# Patient Record
Sex: Male | Born: 1970 | Race: Black or African American | Hispanic: No | Marital: Married | Smoking: Never smoker
Health system: Southern US, Community
[De-identification: ages and names within clinical notes are randomized; demographics above are authoritative.]

## PROBLEM LIST (undated history)

## (undated) DIAGNOSIS — I10 Essential (primary) hypertension: Secondary | ICD-10-CM

## (undated) DIAGNOSIS — T7840XA Allergy, unspecified, initial encounter: Secondary | ICD-10-CM

## (undated) DIAGNOSIS — R569 Unspecified convulsions: Secondary | ICD-10-CM

## (undated) DIAGNOSIS — L509 Urticaria, unspecified: Secondary | ICD-10-CM

## (undated) DIAGNOSIS — G4730 Sleep apnea, unspecified: Secondary | ICD-10-CM

## (undated) DIAGNOSIS — R4020 Unspecified coma: Secondary | ICD-10-CM

## (undated) DIAGNOSIS — I7121 Aneurysm of the ascending aorta, without rupture: Secondary | ICD-10-CM

## (undated) HISTORY — DX: Unspecified coma: R40.20

## (undated) HISTORY — DX: Urticaria, unspecified: L50.9

## (undated) HISTORY — DX: Sleep apnea, unspecified: G47.30

## (undated) HISTORY — DX: Allergy, unspecified, initial encounter: T78.40XA

## (undated) HISTORY — PX: LIPOMA EXCISION: SHX5283

## (undated) HISTORY — PX: SMALL INTESTINE SURGERY: SHX150

## (undated) HISTORY — DX: Unspecified convulsions: R56.9

## (undated) HISTORY — DX: Aneurysm of the ascending aorta, without rupture: I71.21

---

## 2008-06-20 ENCOUNTER — Ambulatory Visit: Payer: Self-pay | Admitting: Family Medicine

## 2008-06-24 ENCOUNTER — Ambulatory Visit: Payer: Self-pay | Admitting: Family Medicine

## 2008-06-24 LAB — CONVERTED CEMR LAB
Nitrite: NEGATIVE
Specific Gravity, Urine: 1.025
WBC Urine, dipstick: NEGATIVE

## 2008-06-30 LAB — CONVERTED CEMR LAB
ALT: 33 units/L (ref 0–53)
AST: 28 units/L (ref 0–37)
Albumin: 4.1 g/dL (ref 3.5–5.2)
Alkaline Phosphatase: 86 units/L (ref 39–117)
BUN: 17 mg/dL (ref 6–23)
Basophils Absolute: 0 10*3/uL (ref 0.0–0.1)
Basophils Relative: 0.7 % (ref 0.0–3.0)
Bilirubin, Direct: 0.1 mg/dL (ref 0.0–0.3)
CO2: 29 meq/L (ref 19–32)
Calcium: 9.5 mg/dL (ref 8.4–10.5)
Chloride: 110 meq/L (ref 96–112)
Cholesterol: 222 mg/dL (ref 0–200)
Creatinine, Ser: 1.2 mg/dL (ref 0.4–1.5)
Direct LDL: 143.8 mg/dL
Eosinophils Absolute: 0.2 10*3/uL (ref 0.0–0.7)
Eosinophils Relative: 3.4 % (ref 0.0–5.0)
GFR calc Af Amer: 89 mL/min
GFR calc non Af Amer: 73 mL/min
Glucose, Bld: 92 mg/dL (ref 70–99)
HCT: 41.5 % (ref 39.0–52.0)
HDL: 50.1 mg/dL (ref >39.0)
Hemoglobin: 14.4 g/dL (ref 13.0–17.0)
Lymphocytes Relative: 33.7 % (ref 12.0–46.0)
MCHC: 34.8 g/dL (ref 30.0–36.0)
MCV: 94 fL (ref 78.0–100.0)
Monocytes Absolute: 0.4 10*3/uL (ref 0.1–1.0)
Monocytes Relative: 9 % (ref 3.0–12.0)
Neutro Abs: 2.6 10*3/uL (ref 1.4–7.7)
Neutrophils Relative %: 53.2 % (ref 43.0–77.0)
Platelets: 306 10*3/uL (ref 150–400)
Potassium: 4.4 meq/L (ref 3.5–5.1)
RBC: 4.41 M/uL (ref 4.22–5.81)
RDW: 12 % (ref 11.5–14.6)
Sodium: 145 meq/L (ref 135–145)
TSH: 1.21 microintl units/mL (ref 0.35–5.50)
Total Bilirubin: 0.9 mg/dL (ref 0.3–1.2)
Total CHOL/HDL Ratio: 4.4
Total Protein: 6.9 g/dL (ref 6.0–8.3)
Triglycerides: 36 mg/dL (ref 0–149)
VLDL: 7 mg/dL (ref 0–40)
WBC: 4.8 10*3/uL (ref 4.5–10.5)

## 2008-11-17 ENCOUNTER — Ambulatory Visit: Payer: Self-pay | Admitting: Family Medicine

## 2008-11-17 DIAGNOSIS — H1045 Other chronic allergic conjunctivitis: Secondary | ICD-10-CM

## 2008-11-17 DIAGNOSIS — J309 Allergic rhinitis, unspecified: Secondary | ICD-10-CM | POA: Insufficient documentation

## 2009-05-18 ENCOUNTER — Ambulatory Visit: Payer: Self-pay | Admitting: Family Medicine

## 2009-05-18 DIAGNOSIS — L509 Urticaria, unspecified: Secondary | ICD-10-CM

## 2009-06-06 ENCOUNTER — Ambulatory Visit: Payer: Self-pay | Admitting: Family Medicine

## 2009-06-06 DIAGNOSIS — J069 Acute upper respiratory infection, unspecified: Secondary | ICD-10-CM

## 2009-08-08 ENCOUNTER — Ambulatory Visit: Payer: Self-pay | Admitting: Family Medicine

## 2009-08-09 ENCOUNTER — Ambulatory Visit: Payer: Self-pay | Admitting: Family Medicine

## 2009-08-09 LAB — CONVERTED CEMR LAB
Nitrite: NEGATIVE
Protein, U semiquant: NEGATIVE
Urobilinogen, UA: 0.2
WBC Urine, dipstick: NEGATIVE

## 2009-08-17 LAB — CONVERTED CEMR LAB
ALT: 51 units/L (ref 0–53)
AST: 32 units/L (ref 0–37)
Alkaline Phosphatase: 93 units/L (ref 39–117)
Basophils Relative: 0.5 % (ref 0.0–3.0)
Chloride: 107 meq/L (ref 96–112)
Direct LDL: 162.9 mg/dL
Eosinophils Relative: 3.5 % (ref 0.0–5.0)
GFR calc non Af Amer: 79.86 mL/min (ref >60)
HCT: 41.7 % (ref 39.0–52.0)
Lymphs Abs: 1.7 10*3/uL (ref 0.7–4.0)
MCV: 97.4 fL (ref 78.0–100.0)
Monocytes Absolute: 0.5 10*3/uL (ref 0.1–1.0)
Monocytes Relative: 9.7 % (ref 3.0–12.0)
Neutrophils Relative %: 54.2 % (ref 43.0–77.0)
Potassium: 4.5 meq/L (ref 3.5–5.1)
RBC: 4.28 M/uL (ref 4.22–5.81)
Total Bilirubin: 0.9 mg/dL (ref 0.3–1.2)
Total CHOL/HDL Ratio: 5
VLDL: 11.6 mg/dL (ref 0.0–40.0)
WBC: 5.2 10*3/uL (ref 4.5–10.5)

## 2009-09-15 ENCOUNTER — Encounter: Payer: Self-pay | Admitting: Family Medicine

## 2009-09-21 ENCOUNTER — Ambulatory Visit: Payer: Self-pay | Admitting: Family Medicine

## 2009-09-26 ENCOUNTER — Telehealth: Payer: Self-pay | Admitting: Family Medicine

## 2009-09-26 ENCOUNTER — Encounter: Payer: Self-pay | Admitting: Family Medicine

## 2009-10-02 ENCOUNTER — Telehealth: Payer: Self-pay | Admitting: Family Medicine

## 2009-10-16 ENCOUNTER — Encounter: Payer: Self-pay | Admitting: Family Medicine

## 2009-11-14 ENCOUNTER — Ambulatory Visit: Payer: Self-pay | Admitting: Family Medicine

## 2010-09-13 NOTE — Progress Notes (Signed)
Summary: ENT referral  Phone Note Call from Patient   Caller: Patient Call For: Dr. Clent Ridges Summary of Call: [phone] Pt would like a referral to ENT as they discussed at his last visit with Dr. Clent Ridges. Initial call taken by: Lynann Beaver CMA,  October 02, 2009 4:30 PM  Follow-up for Phone Call        please ask him what this was about. I have forgotten  Follow-up by: Nelwyn Salisbury MD,  October 03, 2009 1:23 PM  Additional Follow-up for Phone Call Additional follow up Details #1::        Referral to ENT for loud snoring. Additional Follow-up by: Lynann Beaver CMA,  October 03, 2009 2:40 PM    Additional Follow-up for Phone Call Additional follow up Details #2::    okay. Please refer him to ENT for snoring Follow-up by: Nelwyn Salisbury MD,  October 03, 2009 3:11 PM  Additional Follow-up for Phone Call Additional follow up Details #3:: Details for Additional Follow-up Action Taken: referral entered Additional Follow-up by: Alfred Levins, CMA,  October 03, 2009 3:16 PM

## 2010-09-13 NOTE — Progress Notes (Signed)
Summary: Pt called to give list of meds given by allergy doctor  Phone Note Call from Patient Call back at Home Phone [phone]   Caller: Patient Summary of Call: Pt called and said that the following meds were prescribed by Dr Corinda Gubler for allergies: Methylprednisone 16mg  tabs, Fexofenadine 180mg  tabs, 2 Epi pens, Hydroxizine HCL 10mg  tabs. Pt said that today, the hives started up again so he recvd another script of the Methylprednisone.  Initial call taken by: Lucy Antigua,  September 26, 2009 3:25 PM  Follow-up for Phone Call        noted Follow-up by: Nelwyn Salisbury MD,  September 26, 2009 5:04 PM

## 2010-09-13 NOTE — Consult Note (Signed)
Summary: [facility]  [facility]   Imported By: Maryln Gottron 10/23/2009 11:11:16  _____________________________________________________________________  External Attachment:    Type:   Image     Comment:   External Document
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
## 2010-09-13 NOTE — Letter (Signed)
Summary: [facility]  [facility]   Imported By: Maryln Gottron 10/04/2009 15:57:45  _____________________________________________________________________  External Attachment:    Type:   Image     Comment:   External Document

## 2010-09-13 NOTE — Letter (Signed)
Summary: [facility]  [facility]   Imported By: Maryln Gottron 10/18/2009 15:41:01  _____________________________________________________________________  External Attachment:    Type:   Image     Comment:   External Document
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
## 2010-12-10 ENCOUNTER — Encounter: Payer: Self-pay | Admitting: Family Medicine

## 2010-12-10 ENCOUNTER — Ambulatory Visit (INDEPENDENT_AMBULATORY_CARE_PROVIDER_SITE_OTHER): Payer: BC Managed Care – PPO | Admitting: Family Medicine

## 2010-12-10 VITALS — BP 122/82 | HR 68 | Temp 98.9°F | Wt 200.0 lb

## 2010-12-10 DIAGNOSIS — B356 Tinea cruris: Secondary | ICD-10-CM

## 2010-12-10 MED ORDER — KETOCONAZOLE 2 % EX CREA: TOPICAL_CREAM | Freq: Three times a day (TID) | CUTANEOUS | Status: DC

## 2010-12-10 NOTE — Progress Notes (Signed)
  Subjective:    Patient ID: Brandon Campbell, male    DOB: [birth year], 40 y.o.   MRN: [id number]  HPI Here for 2 months of an irritating red rash in the right groin. OTC cortisone cream helps a little. This looks different from the hives that he gets so often.    Review of Systems  Constitutional: Negative.   Skin: Positive for rash.       Objective:   Physical Exam  Constitutional: He appears well-developed and well-nourished.  Skin:       Pale red macular areas in the right groin and proximal thigh           Assessment & Plan:  keep the area as dry as possible.

## 2010-12-25 ENCOUNTER — Encounter: Payer: Self-pay | Admitting: Family Medicine

## 2010-12-25 ENCOUNTER — Ambulatory Visit (INDEPENDENT_AMBULATORY_CARE_PROVIDER_SITE_OTHER): Payer: BC Managed Care – PPO | Admitting: Family Medicine

## 2010-12-25 VITALS — BP 106/68 | HR 109 | Temp 98.1°F | Resp 16 | Wt 199.0 lb

## 2010-12-25 DIAGNOSIS — R209 Unspecified disturbances of skin sensation: Secondary | ICD-10-CM

## 2010-12-25 DIAGNOSIS — R202 Paresthesia of skin: Secondary | ICD-10-CM

## 2010-12-25 NOTE — Progress Notes (Signed)
  Subjective:    Patient ID: Brandon Campbell, male    DOB: 1971-01-16, 40 y.o.   MRN: [id number]  HPI Here with 4 days of very slight numbness on the entire right side of his body from the head to the arm to the trunk to the leg. No other neurologic deficits at all, no blurred vision or HAs or weakness or pain. Yesterday the numbness started to fade away, and today it is almost imperceptible. He cannot account for this, although he does mention some URI symptoms last week including a stuffy head, PND, St, and a dry cough. No fever. These symptoms were going away about the time the numbness started.    Review of Systems  Constitutional: Negative.   HENT: Negative.   Eyes: Negative.   Respiratory: Negative.   Cardiovascular: Negative.   Neurological: Positive for numbness. Negative for dizziness, tremors, seizures, syncope, speech difficulty, weakness, light-headedness and headaches.       Objective:   Physical Exam  Constitutional: He is oriented to person, place, and time. He appears well-developed and well-nourished.  Cardiovascular: Normal rate, regular rhythm, normal heart sounds and intact distal pulses.   Pulmonary/Chest: Effort normal and breath sounds normal.  Neurological: He is alert and oriented to person, place, and time. He has normal reflexes. No cranial nerve deficit. He exhibits normal muscle tone. Coordination normal.          Assessment & Plan:  These symptoms may be related to the viral illness he had last week. At any rate, it seems to be going away. Get plenty of sleep and drink fluids. Recheck prn

## 2010-12-27 ENCOUNTER — Encounter: Payer: Self-pay | Admitting: Family Medicine

## 2010-12-27 ENCOUNTER — Ambulatory Visit (INDEPENDENT_AMBULATORY_CARE_PROVIDER_SITE_OTHER): Payer: BC Managed Care – PPO | Admitting: Family Medicine

## 2010-12-27 VITALS — BP 124/80 | Temp 98.3°F | Wt 196.0 lb

## 2010-12-27 DIAGNOSIS — G629 Polyneuropathy, unspecified: Secondary | ICD-10-CM

## 2010-12-27 DIAGNOSIS — G589 Mononeuropathy, unspecified: Secondary | ICD-10-CM

## 2010-12-27 LAB — CBC WITH DIFFERENTIAL/PLATELET
Basophils Relative: 0.6 % (ref 0.0–3.0)
Eosinophils Relative: 2 % (ref 0.0–5.0)
HCT: 41.3 % (ref 39.0–52.0)
Lymphs Abs: 2.1 10*3/uL (ref 0.7–4.0)
MCV: 95.6 fl (ref 78.0–100.0)
Monocytes Absolute: 0.6 10*3/uL (ref 0.1–1.0)
RBC: 4.32 Mil/uL (ref 4.22–5.81)
WBC: 5.7 10*3/uL (ref 4.5–10.5)

## 2010-12-27 LAB — POCT URINALYSIS DIPSTICK
Blood, UA: NEGATIVE
Glucose, UA: NEGATIVE
Spec Grav, UA: 1.015
Urobilinogen, UA: 1
pH, UA: 6

## 2010-12-27 LAB — HEPATIC FUNCTION PANEL
AST: 37 U/L (ref 0–37)
Alkaline Phosphatase: 76 U/L (ref 39–117)
Bilirubin, Direct: 0.2 mg/dL (ref 0.0–0.3)
Total Protein: 6.1 g/dL (ref 6.0–8.3)

## 2010-12-27 LAB — LIPID PANEL
HDL: 48.2 mg/dL (ref >39.00)
Total CHOL/HDL Ratio: 4

## 2010-12-27 LAB — SEDIMENTATION RATE: Sed Rate: 17 mm/hr (ref 0–22)

## 2010-12-27 LAB — BASIC METABOLIC PANEL
CO2: 30 mEq/L (ref 19–32)
Calcium: 9.5 mg/dL (ref 8.4–10.5)
Potassium: 4.3 mEq/L (ref 3.5–5.1)
Sodium: 140 mEq/L (ref 135–145)

## 2010-12-27 NOTE — Progress Notes (Signed)
  Subjective:    Patient ID: Brandon Campbell, male    DOB: July 21, 1971, 40 y.o.   MRN: [id number]  HPI Here to follow up some intermittent neurologic symptoms that have been going on for about a week. He was here 3 days ago with some numbness that affected the entire right side of his body, and this has subsided. Then yesterday he developed some weakness of the left forearm and hand. This made it difficult for him to grasp objects, type on his computer, or write things on paper. Today this has also resolved. Still no vision changes or speech changes or HA. No fevers. He has no family history of neurologic disorders.    Review of Systems  HENT: Negative.   Eyes: Negative.   Respiratory: Negative.   Cardiovascular: Negative.   Neurological: Positive for weakness and numbness. Negative for dizziness, tremors, seizures, syncope, facial asymmetry, speech difficulty, light-headedness and headaches.  Psychiatric/Behavioral: Negative.        Objective:   Physical Exam  Constitutional: He appears well-developed and well-nourished.  Eyes: Conjunctivae are normal. Pupils are equal, round, and reactive to light.  Neck: No thyromegaly present.  Cardiovascular: Normal rate, regular rhythm, normal heart sounds and intact distal pulses.   Pulmonary/Chest: Effort normal and breath sounds normal.  Lymphadenopathy:    He has no cervical adenopathy.  Neurological: He is alert. He has normal reflexes. No cranial nerve deficit. He exhibits normal muscle tone. Coordination normal.          Assessment & Plan:  Intermittent numbness and weakness, which may indicate a neuropathy of some sort. He is concerned about things like multiple sclerosis, but it is too early to make any specific diagnoses now. We will start with some labs today. We may refer him to Neurology if these symptoms continue.

## 2010-12-28 ENCOUNTER — Telehealth: Payer: Self-pay

## 2010-12-28 NOTE — Telephone Encounter (Signed)
Left a message on voicemail for pt to return call

## 2010-12-28 NOTE — Telephone Encounter (Signed)
Message copied by Earle Gell on Fri Dec 28, 2010  1:44 PM ------      Message from: Dwaine Deter      Created: Fri Dec 28, 2010  1:18 PM       Normal except mildly high chol and a positive ANA. The final ANA result is still pending, so this may or may not be important

## 2011-06-11 ENCOUNTER — Encounter: Payer: Self-pay | Admitting: Family Medicine

## 2011-06-11 ENCOUNTER — Ambulatory Visit (INDEPENDENT_AMBULATORY_CARE_PROVIDER_SITE_OTHER): Payer: BC Managed Care – PPO | Admitting: Family Medicine

## 2011-06-11 VITALS — BP 112/76 | HR 83 | Temp 98.5°F | Wt 195.0 lb

## 2011-06-11 DIAGNOSIS — M545 Low back pain: Secondary | ICD-10-CM

## 2011-06-11 DIAGNOSIS — M542 Cervicalgia: Secondary | ICD-10-CM

## 2011-06-11 NOTE — Progress Notes (Signed)
  Subjective:    Patient ID: Brandon Campbell, male    DOB: 12/24/1970, 40 y.o.   MRN: [id number]  HPI Here for 3 weeks of dull aches and pains in the neck and the lower back. This started during a trip with his wife to [place] when they slept in some bed and breakfast inns. One of these had a very uncomfortable bed. He feels worse after lying in bed or sitting still, then he feels better when he is up moving around. Advil helps.    Review of Systems  Constitutional: Negative.   Musculoskeletal: Positive for back pain.       Objective:   Physical Exam  Constitutional: He appears well-developed and well-nourished.  Musculoskeletal:       Mildly tender in the posterior neck but with no spasm and full ROM. The lower back exam is completely normal           Assessment & Plan:  Probable facet arthropathy. Try Aleve 2 tablets bid for 2 weeks. Recheck prn

## 2011-09-02 ENCOUNTER — Other Ambulatory Visit (INDEPENDENT_AMBULATORY_CARE_PROVIDER_SITE_OTHER): Payer: BC Managed Care – PPO

## 2011-09-02 DIAGNOSIS — Z0000 Encounter for general adult medical examination without abnormal findings: Secondary | ICD-10-CM

## 2011-09-02 LAB — POCT URINALYSIS DIPSTICK
Bilirubin, UA: NEGATIVE
Blood, UA: NEGATIVE
Ketones, UA: NEGATIVE
pH, UA: 6

## 2011-09-02 LAB — LIPID PANEL
Cholesterol: 172 mg/dL (ref 0–200)
HDL: 43.9 mg/dL (ref >39.00)
Triglycerides: 110 mg/dL (ref 0.0–149.0)

## 2011-09-02 LAB — HEPATIC FUNCTION PANEL: Albumin: 3.7 g/dL (ref 3.5–5.2)

## 2011-09-02 LAB — CBC WITH DIFFERENTIAL/PLATELET
Basophils Absolute: 0 10*3/uL (ref 0.0–0.1)
Eosinophils Absolute: 0.1 10*3/uL (ref 0.0–0.7)
HCT: 40.7 % (ref 39.0–52.0)
Hemoglobin: 13.9 g/dL (ref 13.0–17.0)
Lymphocytes Relative: 35.7 % (ref 12.0–46.0)
Lymphs Abs: 2.1 10*3/uL (ref 0.7–4.0)
MCHC: 34.3 g/dL (ref 30.0–36.0)
Neutro Abs: 3 10*3/uL (ref 1.4–7.7)
RDW: 13.4 % (ref 11.5–14.6)

## 2011-09-02 LAB — BASIC METABOLIC PANEL
CO2: 27 mEq/L (ref 19–32)
Calcium: 8.7 mg/dL (ref 8.4–10.5)
Glucose, Bld: 77 mg/dL (ref 70–99)
Sodium: 138 mEq/L (ref 135–145)

## 2011-09-02 LAB — TSH: TSH: 1.46 u[IU]/mL (ref 0.35–5.50)

## 2011-09-03 NOTE — Progress Notes (Signed)
Quick Note:  Left voice message ______ 

## 2011-09-09 ENCOUNTER — Encounter: Payer: Self-pay | Admitting: Family Medicine

## 2011-09-09 ENCOUNTER — Ambulatory Visit (INDEPENDENT_AMBULATORY_CARE_PROVIDER_SITE_OTHER): Payer: BC Managed Care – PPO | Admitting: Family Medicine

## 2011-09-09 VITALS — BP 124/86 | HR 79 | Temp 98.4°F | Ht 72.25 in | Wt 196.0 lb

## 2011-09-09 DIAGNOSIS — Z0000 Encounter for general adult medical examination without abnormal findings: Secondary | ICD-10-CM

## 2011-09-09 MED ORDER — HYDROXYZINE HCL 10 MG PO TABS: 10.0000 mg | ORAL_TABLET | ORAL | Status: DC | PRN

## 2011-09-09 NOTE — Progress Notes (Signed)
  Subjective:    Patient ID: Brandon Campbell, male    DOB: 1971-04-08, 41 y.o.   MRN: [id number]  HPI 41 yr old male for a cpx. He feels fine and has no concerns. He is doing "Insanity" DVD workouts at home with his wife.    Review of Systems  Constitutional: Negative.   HENT: Negative.   Eyes: Negative.   Respiratory: Negative.   Cardiovascular: Negative.   Gastrointestinal: Negative.   Genitourinary: Negative.   Musculoskeletal: Negative.   Skin: Negative.   Neurological: Negative.   Hematological: Negative.   Psychiatric/Behavioral: Negative.        Objective:   Physical Exam  Constitutional: He is oriented to person, place, and time. He appears well-developed and well-nourished. No distress.  HENT:  Head: Normocephalic and atraumatic.  Right Ear: External ear normal.  Left Ear: External ear normal.  Nose: Nose normal.  Mouth/Throat: Oropharynx is clear and moist. No oropharyngeal exudate.  Eyes: Conjunctivae and EOM are normal. Pupils are equal, round, and reactive to light. Right eye exhibits no discharge. Left eye exhibits no discharge. No scleral icterus.  Neck: Neck supple. No JVD present. No tracheal deviation present. No thyromegaly present.  Cardiovascular: Normal rate, regular rhythm, normal heart sounds and intact distal pulses.  Exam reveals no gallop and no friction rub.   No murmur heard. Pulmonary/Chest: Effort normal and breath sounds normal. No respiratory distress. He has no wheezes. He has no rales. He exhibits no tenderness.  Abdominal: Soft. Bowel sounds are normal. He exhibits no distension and no mass. There is no tenderness. There is no rebound and no guarding.  Genitourinary: Rectum normal, prostate normal and penis normal. Guaiac negative stool. No penile tenderness.  Musculoskeletal: Normal range of motion. He exhibits no edema and no tenderness.  Lymphadenopathy:    He has no cervical adenopathy.  Neurological: He is alert and oriented to  person, place, and time. He has normal reflexes. No cranial nerve deficit. He exhibits normal muscle tone. Coordination normal.  Skin: Skin is warm and dry. No rash noted. He is not diaphoretic. No erythema. No pallor.  Psychiatric: He has a normal mood and affect. His behavior is normal. Judgment and thought content normal.          Assessment & Plan:  Well exam

## 2011-10-22 ENCOUNTER — Ambulatory Visit (INDEPENDENT_AMBULATORY_CARE_PROVIDER_SITE_OTHER): Payer: BC Managed Care – PPO | Admitting: Family Medicine

## 2011-10-22 ENCOUNTER — Encounter: Payer: Self-pay | Admitting: Family Medicine

## 2011-10-22 VITALS — BP 120/80 | HR 76 | Temp 97.8°F | Wt 204.0 lb

## 2011-10-22 DIAGNOSIS — H1045 Other chronic allergic conjunctivitis: Secondary | ICD-10-CM

## 2011-10-22 DIAGNOSIS — H1010 Acute atopic conjunctivitis, unspecified eye: Secondary | ICD-10-CM

## 2011-10-22 NOTE — Progress Notes (Signed)
  Subjective:    Patient ID: Brandon Campbell, male    DOB: 02-27-1971, 41 y.o.   MRN: [id number]  HPI Here for 3 days of itching and burning in the eyes. Some tearing but no discharge. He wears contacts but does not have glasses to wear. No other symptoms. He takes Xyzal every day. He has some prednisone eye drops at home which he has been using bid.    Review of Systems  Constitutional: Negative.   HENT: Positive for rhinorrhea and sneezing. Negative for ear pain, congestion, postnasal drip, sinus pressure and ear discharge.   Eyes: Positive for redness and itching. Negative for discharge and visual disturbance.  Respiratory: Negative.        Objective:   Physical Exam  Constitutional: He appears well-developed and well-nourished.  HENT:  Right Ear: External ear normal.  Left Ear: External ear normal.  Nose: Nose normal.  Mouth/Throat: Oropharynx is clear and moist. No oropharyngeal exudate.  Eyes: Conjunctivae are normal. Right eye exhibits no discharge. Left eye exhibits no discharge.       Mild puffiness of the eyelids   Neck: Neck supple.  Pulmonary/Chest: Effort normal and breath sounds normal.  Lymphadenopathy:    He has no cervical adenopathy.          Assessment & Plan:  This is due to allergies as expected. He will continue the eye drops prn. I suggested he get some back up glasses to wear when this occurs.

## 2012-12-21 ENCOUNTER — Other Ambulatory Visit: Payer: BC Managed Care – PPO

## 2012-12-24 ENCOUNTER — Other Ambulatory Visit (INDEPENDENT_AMBULATORY_CARE_PROVIDER_SITE_OTHER): Payer: BC Managed Care – PPO

## 2012-12-24 DIAGNOSIS — Z0000 Encounter for general adult medical examination without abnormal findings: Secondary | ICD-10-CM

## 2012-12-24 LAB — CBC WITH DIFFERENTIAL/PLATELET
Basophils Absolute: 0.1 10*3/uL (ref 0.0–0.1)
Eosinophils Relative: 3.6 % (ref 0.0–5.0)
HCT: 37.6 % — ABNORMAL LOW (ref 39.0–52.0)
Lymphs Abs: 2 10*3/uL (ref 0.7–4.0)
Monocytes Absolute: 0.5 10*3/uL (ref 0.1–1.0)
Monocytes Relative: 8.8 % (ref 3.0–12.0)
Neutrophils Relative %: 49.6 % (ref 43.0–77.0)
Platelets: 291 10*3/uL (ref 150.0–400.0)
RDW: 13.4 % (ref 11.5–14.6)
WBC: 5.3 10*3/uL (ref 4.5–10.5)

## 2012-12-24 LAB — LIPID PANEL: Total CHOL/HDL Ratio: 5

## 2012-12-24 LAB — BASIC METABOLIC PANEL
BUN: 14 mg/dL (ref 6–23)
Calcium: 9.1 mg/dL (ref 8.4–10.5)
Creatinine, Ser: 1.2 mg/dL (ref 0.4–1.5)
GFR: 85.23 mL/min (ref >60.00)
Glucose, Bld: 87 mg/dL (ref 70–99)
Potassium: 3.9 mEq/L (ref 3.5–5.1)

## 2012-12-24 LAB — POCT URINALYSIS DIPSTICK
Blood, UA: NEGATIVE
Nitrite, UA: NEGATIVE
Urobilinogen, UA: 0.2
pH, UA: 6

## 2012-12-24 LAB — HEPATIC FUNCTION PANEL
AST: 24 U/L (ref 0–37)
Bilirubin, Direct: 0.1 mg/dL (ref 0.0–0.3)
Total Bilirubin: 0.7 mg/dL (ref 0.3–1.2)

## 2012-12-24 LAB — PSA: PSA: 0.77 ng/mL (ref 0.10–4.00)

## 2012-12-24 LAB — TSH: TSH: 2.28 u[IU]/mL (ref 0.35–5.50)

## 2012-12-25 ENCOUNTER — Other Ambulatory Visit: Payer: BC Managed Care – PPO

## 2012-12-28 ENCOUNTER — Ambulatory Visit (INDEPENDENT_AMBULATORY_CARE_PROVIDER_SITE_OTHER): Payer: BC Managed Care – PPO | Admitting: Family Medicine

## 2012-12-28 ENCOUNTER — Encounter: Payer: Self-pay | Admitting: Family Medicine

## 2012-12-28 VITALS — BP 112/70 | HR 87 | Temp 98.1°F | Ht 72.5 in | Wt 209.0 lb

## 2012-12-28 DIAGNOSIS — Z0000 Encounter for general adult medical examination without abnormal findings: Secondary | ICD-10-CM

## 2012-12-28 NOTE — Progress Notes (Signed)
  Subjective:    Patient ID: Brandon Campbell, male    DOB: Nov 20, 1970, 42 y.o.   MRN: [id number]  HPI 42 yr old male for a cpx. He feels fine and has no concerns.    Review of Systems  Constitutional: Negative.   HENT: Negative.   Eyes: Negative.   Respiratory: Negative.   Cardiovascular: Negative.   Gastrointestinal: Negative.   Genitourinary: Negative.   Musculoskeletal: Negative.   Skin: Negative.   Neurological: Negative.   Psychiatric/Behavioral: Negative.        Objective:   Physical Exam  Constitutional: He is oriented to person, place, and time. He appears well-developed and well-nourished. No distress.  HENT:  Head: Normocephalic and atraumatic.  Right Ear: External ear normal.  Left Ear: External ear normal.  Nose: Nose normal.  Mouth/Throat: Oropharynx is clear and moist. No oropharyngeal exudate.  Eyes: Conjunctivae and EOM are normal. Pupils are equal, round, and reactive to light. Right eye exhibits no discharge. Left eye exhibits no discharge. No scleral icterus.  Neck: Neck supple. No JVD present. No tracheal deviation present. No thyromegaly present.  Cardiovascular: Normal rate, regular rhythm, normal heart sounds and intact distal pulses.  Exam reveals no gallop and no friction rub.   No murmur heard. Pulmonary/Chest: Effort normal and breath sounds normal. No respiratory distress. He has no wheezes. He has no rales. He exhibits no tenderness.  Abdominal: Soft. Bowel sounds are normal. He exhibits no distension and no mass. There is no tenderness. There is no rebound and no guarding.  Genitourinary: Rectum normal, prostate normal and penis normal. Guaiac negative stool. No penile tenderness.  Musculoskeletal: Normal range of motion. He exhibits no edema and no tenderness.  Lymphadenopathy:    He has no cervical adenopathy.  Neurological: He is alert and oriented to person, place, and time. He has normal reflexes. No cranial nerve deficit. He exhibits  normal muscle tone. Coordination normal.  Skin: Skin is warm and dry. No rash noted. He is not diaphoretic. No erythema. No pallor.  Psychiatric: He has a normal mood and affect. His behavior is normal. Judgment and thought content normal.          Assessment & Plan:  Well exam.

## 2014-07-05 ENCOUNTER — Other Ambulatory Visit (INDEPENDENT_AMBULATORY_CARE_PROVIDER_SITE_OTHER): Payer: BC Managed Care – PPO

## 2014-07-05 DIAGNOSIS — Z0000 Encounter for general adult medical examination without abnormal findings: Secondary | ICD-10-CM

## 2014-07-05 LAB — CBC WITH DIFFERENTIAL/PLATELET
BASOS PCT: 0.7 % (ref 0.0–3.0)
Basophils Absolute: 0 10*3/uL (ref 0.0–0.1)
Eosinophils Absolute: 0.2 10*3/uL (ref 0.0–0.7)
Eosinophils Relative: 2.8 % (ref 0.0–5.0)
HEMATOCRIT: 43.2 % (ref 39.0–52.0)
HEMOGLOBIN: 14.6 g/dL (ref 13.0–17.0)
LYMPHS ABS: 2.3 10*3/uL (ref 0.7–4.0)
LYMPHS PCT: 36.4 % (ref 12.0–46.0)
MCHC: 33.7 g/dL (ref 30.0–36.0)
MCV: 93.9 fl (ref 78.0–100.0)
MONOS PCT: 10.2 % (ref 3.0–12.0)
Monocytes Absolute: 0.6 10*3/uL (ref 0.1–1.0)
NEUTROS ABS: 3.2 10*3/uL (ref 1.4–7.7)
Neutrophils Relative %: 49.9 % (ref 43.0–77.0)
Platelets: 325 10*3/uL (ref 150.0–400.0)
RBC: 4.6 Mil/uL (ref 4.22–5.81)
RDW: 13.1 % (ref 11.5–15.5)
WBC: 6.3 10*3/uL (ref 4.0–10.5)

## 2014-07-05 LAB — LIPID PANEL
CHOL/HDL RATIO: 6
Cholesterol: 212 mg/dL — ABNORMAL HIGH (ref 0–200)
HDL: 34.5 mg/dL — ABNORMAL LOW (ref >39.00)
LDL CALC: 149 mg/dL — AB (ref 0–99)
NONHDL: 177.5
TRIGLYCERIDES: 142 mg/dL (ref 0.0–149.0)
VLDL: 28.4 mg/dL (ref 0.0–40.0)

## 2014-07-05 LAB — POCT URINALYSIS DIPSTICK
BILIRUBIN UA: NEGATIVE
GLUCOSE UA: NEGATIVE
KETONES UA: NEGATIVE
Leukocytes, UA: NEGATIVE
Nitrite, UA: NEGATIVE
Protein, UA: NEGATIVE
RBC UA: NEGATIVE
Urobilinogen, UA: 0.2
pH, UA: 6

## 2014-07-05 LAB — BASIC METABOLIC PANEL
BUN: 13 mg/dL (ref 6–23)
CHLORIDE: 100 meq/L (ref 96–112)
CO2: 28 meq/L (ref 19–32)
CREATININE: 1.4 mg/dL (ref 0.4–1.5)
Calcium: 9.5 mg/dL (ref 8.4–10.5)
GFR: 72.69 mL/min (ref >60.00)
Glucose, Bld: 88 mg/dL (ref 70–99)
Potassium: 4.5 mEq/L (ref 3.5–5.1)
Sodium: 137 mEq/L (ref 135–145)

## 2014-07-05 LAB — HEPATIC FUNCTION PANEL
ALBUMIN: 4.3 g/dL (ref 3.5–5.2)
ALT: 29 U/L (ref 0–53)
AST: 25 U/L (ref 0–37)
Alkaline Phosphatase: 116 U/L (ref 39–117)
BILIRUBIN DIRECT: 0.1 mg/dL (ref 0.0–0.3)
TOTAL PROTEIN: 6.8 g/dL (ref 6.0–8.3)
Total Bilirubin: 0.8 mg/dL (ref 0.2–1.2)

## 2014-07-05 LAB — TSH: TSH: 2 u[IU]/mL (ref 0.35–4.50)

## 2014-07-05 LAB — PSA: PSA: 0.86 ng/mL (ref 0.10–4.00)

## 2014-07-12 ENCOUNTER — Encounter: Payer: Self-pay | Admitting: Family Medicine

## 2014-07-12 ENCOUNTER — Ambulatory Visit (INDEPENDENT_AMBULATORY_CARE_PROVIDER_SITE_OTHER): Payer: BC Managed Care – PPO | Admitting: Family Medicine

## 2014-07-12 VITALS — BP 118/83 | HR 68 | Temp 98.4°F | Ht 72.5 in | Wt 216.0 lb

## 2014-07-12 DIAGNOSIS — Z0000 Encounter for general adult medical examination without abnormal findings: Secondary | ICD-10-CM

## 2014-07-12 NOTE — Progress Notes (Signed)
   Subjective:    Patient ID: Brandon Campbell, male    DOB: 06/11/1971, 43 y.o.   MRN: [id number]  HPI 43 yr old male for a cpx. He feels well. We discussed the increase in the LDL in his labs and he reports that his diet has not chenaged much in the past year. However he used to take up to 4 capsules of fish oil daily but hs stopped this a year ago.    Review of Systems  Constitutional: Negative.   HENT: Negative.   Eyes: Negative.   Respiratory: Negative.   Cardiovascular: Negative.   Gastrointestinal: Negative.   Genitourinary: Negative.   Musculoskeletal: Negative.   Skin: Negative.   Neurological: Negative.   Psychiatric/Behavioral: Negative.        Objective:   Physical Exam  Constitutional: He is oriented to person, place, and time. He appears well-developed and well-nourished. No distress.  HENT:  Head: Normocephalic and atraumatic.  Right Ear: External ear normal.  Left Ear: External ear normal.  Nose: Nose normal.  Mouth/Throat: Oropharynx is clear and moist. No oropharyngeal exudate.  Eyes: Conjunctivae and EOM are normal. Pupils are equal, round, and reactive to light. Right eye exhibits no discharge. Left eye exhibits no discharge. No scleral icterus.  Neck: Neck supple. No JVD present. No tracheal deviation present. No thyromegaly present.  Cardiovascular: Normal rate, regular rhythm, normal heart sounds and intact distal pulses.  Exam reveals no gallop and no friction rub.   No murmur heard. Pulmonary/Chest: Effort normal and breath sounds normal. No respiratory distress. He has no wheezes. He has no rales. He exhibits no tenderness.  Abdominal: Soft. Bowel sounds are normal. He exhibits no distension and no mass. There is no tenderness. There is no rebound and no guarding.  Genitourinary: Rectum normal, prostate normal and penis normal. Guaiac negative stool. No penile tenderness.  Musculoskeletal: Normal range of motion. He exhibits no edema or tenderness.   Lymphadenopathy:    He has no cervical adenopathy.  Neurological: He is alert and oriented to person, place, and time. He has normal reflexes. No cranial nerve deficit. He exhibits normal muscle tone. Coordination normal.  Skin: Skin is warm and dry. No rash noted. He is not diaphoretic. No erythema. No pallor.  Psychiatric: He has a normal mood and affect. His behavior is normal. Judgment and thought content normal.          Assessment & Plan:  Well exam. He will resume taking fish oil supplements.

## 2014-07-12 NOTE — Progress Notes (Signed)
Pre visit review using our clinic review tool, if applicable. No additional management support is needed unless otherwise documented below in the visit note. 

## 2014-09-15 ENCOUNTER — Encounter (HOSPITAL_COMMUNITY): Payer: Self-pay | Admitting: Emergency Medicine

## 2014-09-15 ENCOUNTER — Emergency Department (HOSPITAL_COMMUNITY)
Admission: EM | Admit: 2014-09-15 | Discharge: 2014-09-15 | Disposition: A | Discharge status: Home / Self Care | Payer: BLUE CROSS/BLUE SHIELD | Attending: Emergency Medicine | Admitting: Emergency Medicine

## 2014-09-15 DIAGNOSIS — Z872 Personal history of diseases of the skin and subcutaneous tissue: Secondary | ICD-10-CM | POA: Diagnosis not present

## 2014-09-15 DIAGNOSIS — G40909 Epilepsy, unspecified, not intractable, without status epilepticus: Secondary | ICD-10-CM | POA: Diagnosis present

## 2014-09-15 DIAGNOSIS — Z79899 Other long term (current) drug therapy: Secondary | ICD-10-CM | POA: Diagnosis not present

## 2014-09-15 DIAGNOSIS — R569 Unspecified convulsions: Secondary | ICD-10-CM

## 2014-09-15 LAB — CBC WITH DIFFERENTIAL/PLATELET
Basophils Absolute: 0 10*3/uL (ref 0.0–0.1)
Basophils Relative: 0 % (ref 0–1)
EOS PCT: 2 % (ref 0–5)
Eosinophils Absolute: 0.2 10*3/uL (ref 0.0–0.7)
HEMATOCRIT: 40.2 % (ref 39.0–52.0)
Hemoglobin: 14.2 g/dL (ref 13.0–17.0)
LYMPHS ABS: 3.6 10*3/uL (ref 0.7–4.0)
Lymphocytes Relative: 33 % (ref 12–46)
MCH: 31.8 pg (ref 26.0–34.0)
MCHC: 35.3 g/dL (ref 30.0–36.0)
MCV: 90.1 fL (ref 78.0–100.0)
MONO ABS: 0.7 10*3/uL (ref 0.1–1.0)
Monocytes Relative: 6 % (ref 3–12)
NEUTROS ABS: 6.3 10*3/uL (ref 1.7–7.7)
Neutrophils Relative %: 59 % (ref 43–77)
PLATELETS: 299 10*3/uL (ref 150–400)
RBC: 4.46 MIL/uL (ref 4.22–5.81)
RDW: 13.2 % (ref 11.5–15.5)
WBC: 10.7 10*3/uL — ABNORMAL HIGH (ref 4.0–10.5)

## 2014-09-15 LAB — COMPREHENSIVE METABOLIC PANEL
ALT: 36 U/L (ref 0–53)
ANION GAP: 12 (ref 5–15)
AST: 33 U/L (ref 0–37)
Albumin: 4.1 g/dL (ref 3.5–5.2)
Alkaline Phosphatase: 105 U/L (ref 39–117)
BILIRUBIN TOTAL: 0.8 mg/dL (ref 0.3–1.2)
BUN: 20 mg/dL (ref 6–23)
CO2: 21 mmol/L (ref 19–32)
Calcium: 9 mg/dL (ref 8.4–10.5)
Chloride: 103 mmol/L (ref 96–112)
Creatinine, Ser: 1.48 mg/dL — ABNORMAL HIGH (ref 0.50–1.35)
GFR calc Af Amer: 66 mL/min — ABNORMAL LOW (ref >90)
GFR calc non Af Amer: 57 mL/min — ABNORMAL LOW (ref >90)
Glucose, Bld: 166 mg/dL — ABNORMAL HIGH (ref 70–99)
POTASSIUM: 3.3 mmol/L — AB (ref 3.5–5.1)
SODIUM: 136 mmol/L (ref 135–145)
Total Protein: 6.8 g/dL (ref 6.0–8.3)

## 2014-09-15 LAB — CBG MONITORING, ED: Glucose-Capillary: 116 mg/dL — ABNORMAL HIGH (ref 70–99)

## 2014-09-15 MED ORDER — SODIUM CHLORIDE 0.9 % IV SOLN
100.0000 mg | Freq: Once | INTRAVENOUS | Status: AC
  Administered 2014-09-15: 100 mg via INTRAVENOUS
  Filled 2014-09-15: qty 10

## 2014-09-15 MED ORDER — LACOSAMIDE 50 MG PO TABS
50.0000 mg | ORAL_TABLET | Freq: Two times a day (BID) | ORAL | Status: DC
Start: 2014-09-15 — End: 2014-09-16

## 2014-09-15 NOTE — ED Notes (Signed)
Patient is alert and orientedx4.  Patient was explained discharge instructions and they understood them with no questions.  The patient's wife, [name] is taking the patient home.

## 2014-09-15 NOTE — Discharge Instructions (Signed)

## 2014-09-15 NOTE — ED Provider Notes (Deleted)
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 He had seen a neurologist when [place] living in [place] but does not have a neurologist locally. He has not currently on any medications for seizures. On exam, he is awake and alert. Lungs are clear and heart is regular rate and rhythm. Neurologic exam is normal. Because of history of complex partial seizures, it is felt that he will need to be started on anticonvulsants. Case was discussed with neurology who recommended lacosamide and loading doses given in the ED.  I saw and evaluated the patient, reviewed the resident's note and I agree with the findings and plan.   Dione Boozeavid Nolan Lasser, MD 09/15/14 2218

## 2014-09-15 NOTE — ED Notes (Signed)
Family at bedside. 

## 2014-09-15 NOTE — ED Notes (Signed)
The patient's wife was in the kitchen cooking and he was in the room watching TV.  The wife advised EMS that she heard something fall and when she went in to the room he was on the floor having a grand mal seizure. ( she is familiar with seizures because her brother has seizures) The patient's wife said the seizure lasted about 45sec and the patient was post-ictal when EMS arrived.  The patient was not incontinent of urine or stool.  He was transported here to be evaluated.  The patient told EMS that he just felt like he fell asleep and when he woke up he was not aware of what had happened.

## 2014-09-15 NOTE — ED Provider Notes (Signed)
CSN: [id number]     Arrival date & time 09/15/14  1951 History   First MD Initiated Contact with Patient 09/15/14 2016     Chief Complaint  Patient presents with  . Seizures    The patient's wife was in the kitchen cooking and he was in the room watching TV.  The wife advised EMS that she heard something fall and when she went in to the room he was on the floor having a grand mal seizure.     (Consider location/radiation/quality/duration/timing/severity/associated sxs/prior Treatment) HPI 44 year old male with past history of partial complex seizures which was diagnosed in 2006 for which patient was previously on Keppra presents to ED for grand mal type seizure lasting 1-2 minutes today. Prior to this patient was in his usual state of health and has no report of recent illness. Patient denies having used alcohol or other drugs. Not taking any medications. No recent head injury, ear infection. Patient states he feels normal now. His sister witnessed this and states he did not appear to incur any injuries during his seizure. Patient reports since stopping Keppra many years ago he has had his partial seizures which she describes as an aura and disorientation without any abnormal movements at least monthly. He states they have been improving. He states he does not see a neurologist any longer. No other complaints at this time. Past Medical History  Diagnosis Date  . Seizures     partial complex in 2006, resolved   . Allergy     sees Dr. Sidney Ace  . Hives     sees Dr. Reginia Naas at [facility]   History reviewed. No pertinent past surgical history. History reviewed. No pertinent family history. History  Substance Use Topics  . Smoking status: Never Smoker   . Smokeless tobacco: Never Used  . Alcohol Use: No    Review of Systems  Constitutional: Negative for fever, activity change and appetite change.  HENT: Negative for congestion, rhinorrhea and sore throat.   Eyes:  Negative for visual disturbance.  Respiratory: Negative for cough and shortness of breath.   Cardiovascular: Negative for chest pain, palpitations and leg swelling.  Gastrointestinal: Negative for nausea, vomiting, abdominal pain and diarrhea.  Genitourinary: Negative for dysuria, flank pain, decreased urine volume and difficulty urinating.  Musculoskeletal: Negative for back pain and neck pain.  Skin: Negative for rash.  Neurological: Positive for seizures. Negative for dizziness, syncope, speech difficulty, weakness, light-headedness, numbness and headaches.  Psychiatric/Behavioral: Negative for confusion.      Allergies  Review of patient's allergies indicates no known allergies.  Home Medications   Prior to Admission medications   Medication Sig Start Date End Date Taking? Authorizing Provider  fish oil-omega-3 fatty acids 1000 MG capsule Take 2 g by mouth daily.    Historical Provider, MD  loratadine (CLARITIN) 10 MG tablet Take 10 mg by mouth daily.    Historical Provider, MD   BP 129/79 mmHg  Pulse 77  Temp(Src) 97.7 F (36.5 C) (Oral)  Resp 15  SpO2 92% Physical Exam  Constitutional: He is oriented to person, place, and time. He appears well-developed and well-nourished. No distress.  HENT:  Head: Normocephalic and atraumatic.  Nose: Nose normal.  Mouth/Throat: Oropharynx is clear and moist. No oropharyngeal exudate.  Eyes: Conjunctivae and EOM are normal.  Neck: Normal range of motion. Neck supple. No JVD present.  Cardiovascular: Normal rate, regular rhythm, normal heart sounds and intact distal pulses.   Pulmonary/Chest: Effort normal and  breath sounds normal. No respiratory distress.  Abdominal: Soft. He exhibits no distension. There is no tenderness. There is no rebound and no guarding.  Musculoskeletal: Normal range of motion.  Neurological: He is alert and oriented to person, place, and time. No cranial nerve deficit. GCS eye subscore is 4. GCS verbal subscore  is 5. GCS motor subscore is 6.  HDS, AAOx4. PERRL, EOMI, TML, face sym. CN 2-12 grossly intact. 5/5 sym, no drift, SILT, normal gait and coordination.    Skin: Skin is warm and dry. No rash noted.  Psychiatric: He has a normal mood and affect.  Nursing note and vitals reviewed.   ED Course  Procedures (including critical care time) Labs Review Labs Reviewed  CBC WITH DIFFERENTIAL/PLATELET - Abnormal; Notable for the following:    WBC 10.7 (*)    All other components within normal limits  COMPREHENSIVE METABOLIC PANEL - Abnormal; Notable for the following:    Potassium 3.3 (*)    Glucose, Bld 166 (*)    Creatinine, Ser 1.48 (*)    GFR calc non Af Amer 57 (*)    GFR calc Af Amer 66 (*)    All other components within normal limits  CBG MONITORING, ED - Abnormal; Notable for the following:    Glucose-Capillary 116 (*)    All other components within normal limits    Imaging Review No results found.   EKG Interpretation None      MDM   Final diagnoses:  None    KHORI ROSEVEAR is a 44 y.o. male with H&P as above. Patient arrives hemodynamically stable and in no apparent distress after having a seizure. Patient is at baseline now per family. Patient has history of partial complex seizure but has never had a grand mal seizure like he did today. Patient has a benign neuro exam currently. No recent signs or symptoms to suggest infection. Screening labs were sent and are relatively unremarkable. Patient was noted to have a creatinine of 1.48 and 8 appears that patient has had similar elevated creatinine last year. Patient states he is drinking plenty of fluids and having no urinary symptoms. I have advised him to follow closely with his PCP for recheck. Discussed case with neurology and they advise IV Vimpat load and have patient take Vimpat 50 mg twice a day with close neurology follow-up. Clinical Impression: 1. Seizure     Disposition: Discharge  Condition: Good  I  have discussed the results, Dx and Tx plan with the pt(& family if present). He/she/they expressed understanding and agree(s) with the plan. Discharge instructions discussed at great length. Strict return precautions discussed and pt &/or family have verbalized understanding of the instructions. No further questions at time of discharge.    New Prescriptions   LACOSAMIDE (VIMPAT) 50 MG TABS TABLET    Take 1 tablet (50 mg total) by mouth 2 (two) times daily.    Follow Up: [facility] [address] [place] [address] [phone]  To establish neurology care, call above for appointment in next 2-3 weeks  [facility] [address] [place] [address] [phone]  To establish neurology care, call above for appointment in next 2-3 weeks  [facility] EMERGENCY DEPARTMENT [address] [id number] Wilhe[place] Bonito [place] [address] [phone]  If symptoms worsen   Pt seen in conjunction with Dr. Dione Booze, MD  Ames Dura, DO [facility] Emergency Medicine Resident - PGY-2  Ames DuraStephen Brittan Mapel, MD 09/15/14 [id number]  Dione Boozeavid Glick, MD 09/15/14 [id number]  Dione Boozeavid Glick, MD 09/16/14 [id number]

## 2014-09-15 NOTE — ED Notes (Signed)
Pt cbg 116  

## 2014-09-16 ENCOUNTER — Ambulatory Visit (INDEPENDENT_AMBULATORY_CARE_PROVIDER_SITE_OTHER): Payer: BLUE CROSS/BLUE SHIELD | Admitting: Diagnostic Neuroimaging

## 2014-09-16 ENCOUNTER — Encounter: Payer: Self-pay | Admitting: Diagnostic Neuroimaging

## 2014-09-16 VITALS — BP 154/96 | HR 79 | Ht 72.0 in | Wt 216.6 lb

## 2014-09-16 DIAGNOSIS — G40209 Localization-related (focal) (partial) symptomatic epilepsy and epileptic syndromes with complex partial seizures, not intractable, without status epilepticus: Secondary | ICD-10-CM

## 2014-09-16 MED ORDER — LACOSAMIDE 100 MG PO TABS: 100.0000 mg | ORAL_TABLET | Freq: Two times a day (BID) | ORAL | Status: DC
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
## 2014-09-16 NOTE — Patient Instructions (Signed)
Continue vimpat 50mg  twice a day for 1 week, then increase to 100mg  twice a day.

## 2014-09-19 ENCOUNTER — Telehealth: Payer: Self-pay | Admitting: Diagnostic Neuroimaging

## 2014-09-19 NOTE — Telephone Encounter (Signed)
Vimpat is making Brandon Campbell really sleepy at 50mg  bid (ER dose). Hes not going to be able to tolerate 100mg  bid. He would be way too groggy to work (His employer is going to work with him! Thank the Lord!). Hes real sensitive to medication. Brandon Campbell wants to stick with 50mg  bid for now since he feels that sleep deprivation and all the caffeine he had on Thursday tipped the scales (He was otherwise fine before 09/15/14 evening). Hes going to get back on track and get his quality of life back

## 2014-09-26 ENCOUNTER — Ambulatory Visit: Payer: BLUE CROSS/BLUE SHIELD | Admitting: Family Medicine

## 2014-09-26 ENCOUNTER — Telehealth: Payer: Self-pay | Admitting: Family Medicine

## 2014-09-26 NOTE — Telephone Encounter (Signed)
Pt was on scheduled to see Dr. Clent RidgesFry this afternoon, I called pt and left a voice message advising him to reschedule this. Per Dr. Clent RidgesFry we should not charge pt due to weather.

## 2014-09-30 ENCOUNTER — Encounter: Payer: Self-pay | Admitting: Family Medicine

## 2014-09-30 ENCOUNTER — Ambulatory Visit (INDEPENDENT_AMBULATORY_CARE_PROVIDER_SITE_OTHER): Payer: BLUE CROSS/BLUE SHIELD | Admitting: Family Medicine

## 2014-09-30 VITALS — BP 131/84 | HR 66 | Temp 98.0°F | Ht 72.0 in | Wt 212.0 lb

## 2014-09-30 DIAGNOSIS — R569 Unspecified convulsions: Secondary | ICD-10-CM

## 2014-09-30 NOTE — Progress Notes (Signed)
   Subjective:    Patient ID: Brandon Campbell, male    DOB: [birth year], 44 y.o.   MRN: [id number]  HPI Here to follow up an ER visit on 09-15-14 for a tonic-clonic seizure. He was at home watching TV when this occurred, and his wife was present. EMS took him to the ED where his exam and all labs were normal. He has a hx of partial complex seizures and took Keppra in the past. He had been off this for years. After the generalized seizure he was started on Lacosamide 50 mg bid and he saw Dr. Marjory LiesPenumalli in Neurology the next day. Dr. Marjory LiesPenumalli advised him to stay on this dose for one week and then to increase the Lacosamide to 100 mg bid. However Thayer OhmChris has not done so and has kept the dose at 50 mg bid. He is very reluctant to be on seizure meds at all and he plans to come off of them ASAP. He notes that during the 2 weeks prior to the seizure he was working a lot of overtime hours on his job, he was sleep deprived, and he was using very large amounts of caffeine. He was drinking coffee, Goodyear TireMountain Dews and energy drinks like Red Bull all day, and this is very atypical for him. Since the seizure he has avoided using any caffeine whatsoever. Of course he cannot drive for 6 months, so his wife is driving to work. She is with him today. In general he feels fine and has had no seizure like activity since that day. He is scheduled for a brain MRI on 10-14-14.    Review of Systems  Constitutional: Negative.   Respiratory: Negative.   Cardiovascular: Negative.   Neurological: Negative.        Objective:   Physical Exam  Constitutional: He is oriented to person, place, and time. He appears well-developed and well-nourished.  Cardiovascular: Normal rate, regular rhythm, normal heart sounds and intact distal pulses.   Pulmonary/Chest: Effort normal and breath sounds normal.  Neurological: He is alert and oriented to person, place, and time. He has normal reflexes. No cranial nerve deficit. He exhibits normal  muscle tone. Coordination normal.          Assessment & Plan:  He seems to be doing well. He will follow up with Dr. Marjory LiesPenumalli on 10-28-14. The two of them will need to negotiate about his medication at that time.

## 2014-09-30 NOTE — Progress Notes (Signed)
Pre visit review using our clinic review tool, if applicable. No additional management support is needed unless otherwise documented below in the visit note. 

## 2014-10-14 ENCOUNTER — Ambulatory Visit
Admission: RE | Admit: 2014-10-14 | Discharge: 2014-10-14 | Disposition: A | Discharge status: Home / Self Care | Payer: BLUE CROSS/BLUE SHIELD | Source: Ambulatory Visit | Attending: Diagnostic Neuroimaging | Admitting: Diagnostic Neuroimaging

## 2014-10-14 DIAGNOSIS — G40209 Localization-related (focal) (partial) symptomatic epilepsy and epileptic syndromes with complex partial seizures, not intractable, without status epilepticus: Secondary | ICD-10-CM | POA: Diagnosis not present

## 2014-10-14 MED ORDER — GADOBENATE DIMEGLUMINE 529 MG/ML IV SOLN
20.0000 mL | Freq: Once | INTRAVENOUS | Status: AC | PRN
  Administered 2014-10-14: 20 mL via INTRAVENOUS

## 2014-10-27 ENCOUNTER — Ambulatory Visit (INDEPENDENT_AMBULATORY_CARE_PROVIDER_SITE_OTHER): Payer: BLUE CROSS/BLUE SHIELD | Admitting: Diagnostic Neuroimaging

## 2014-10-27 ENCOUNTER — Encounter: Payer: Self-pay | Admitting: Diagnostic Neuroimaging

## 2014-10-27 VITALS — BP 137/85 | HR 72 | Temp 97.9°F | Ht 72.0 in | Wt 215.0 lb

## 2014-10-27 DIAGNOSIS — G40209 Localization-related (focal) (partial) symptomatic epilepsy and epileptic syndromes with complex partial seizures, not intractable, without status epilepticus: Secondary | ICD-10-CM
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
## 2014-11-04 ENCOUNTER — Telehealth: Payer: Self-pay | Admitting: Diagnostic Neuroimaging

## 2014-11-04 MED ORDER — LACOSAMIDE 100 MG PO TABS: 100.0000 mg | ORAL_TABLET | Freq: Two times a day (BID) | ORAL | Status: DC

## 2014-11-04 NOTE — Telephone Encounter (Signed)
I called numbers 3 times, no answer. Left message. Will restart vimpat 100mg  twice a day. -VRP

## 2014-11-04 NOTE — Telephone Encounter (Signed)
Dr. Eduard ClosBethea called this morning stating pt had a seizure last night and is completely out of Vimpat.  [place] and would like a Rx for Vimpat faxed to [facility] [address] [place],  [place] [id number].  There phone number is [phone].  She would like for someone to send her a email or call her as soon as this is done so she can pick up the Rx.  Please advise.

## 2015-03-10 ENCOUNTER — Ambulatory Visit (INDEPENDENT_AMBULATORY_CARE_PROVIDER_SITE_OTHER): Payer: BLUE CROSS/BLUE SHIELD | Admitting: Family Medicine

## 2015-03-10 ENCOUNTER — Encounter: Payer: Self-pay | Admitting: Family Medicine

## 2015-03-10 VITALS — BP 127/83 | HR 65 | Temp 98.3°F | Ht 72.0 in | Wt 213.0 lb

## 2015-03-10 DIAGNOSIS — J209 Acute bronchitis, unspecified: Secondary | ICD-10-CM

## 2015-03-10 MED ORDER — AZITHROMYCIN 250 MG PO TABS: ORAL_TABLET | ORAL | Status: DC

## 2015-03-10 NOTE — Progress Notes (Signed)
Pre visit review using our clinic review tool, if applicable. No additional management support is needed unless otherwise documented below in the visit note. 

## 2015-03-10 NOTE — Progress Notes (Signed)
   Subjective:    Patient ID: Brandon Campbell, male    DOB: July 11, 1971, 44 y.o.   MRN: [id number]  HPI Here for 5 days of chest tightness, wheezing, and a dry cough. No fever. Using Benzonatate and cough drops.    Review of Systems  Constitutional: Negative.   HENT: Positive for congestion. Negative for postnasal drip and sinus pressure.   Eyes: Negative.   Respiratory: Positive for cough, chest tightness and wheezing. Negative for shortness of breath.        Objective:   Physical Exam  Constitutional: He appears well-developed and well-nourished.  HENT:  Right Ear: External ear normal.  Left Ear: External ear normal.  Nose: Nose normal.  Mouth/Throat: Oropharynx is clear and moist.  Eyes: Conjunctivae are normal.  Pulmonary/Chest: Effort normal. He has no rales.  Scattered rhonchi and wheezes   Lymphadenopathy:    He has no cervical adenopathy.          Assessment & Plan:  Bronchitis, treat with a Zpack

## 2015-05-15 ENCOUNTER — Ambulatory Visit (INDEPENDENT_AMBULATORY_CARE_PROVIDER_SITE_OTHER): Payer: BLUE CROSS/BLUE SHIELD | Admitting: Diagnostic Neuroimaging

## 2015-05-15 ENCOUNTER — Encounter: Payer: Self-pay | Admitting: Diagnostic Neuroimaging

## 2015-05-15 VITALS — BP 126/81 | HR 73 | Ht 72.0 in | Wt 215.0 lb

## 2015-05-15 DIAGNOSIS — G40209 Localization-related (focal) (partial) symptomatic epilepsy and epileptic syndromes with complex partial seizures, not intractable, without status epilepticus: Secondary | ICD-10-CM

## 2015-05-15 MED ORDER — LACOSAMIDE 100 MG PO TABS: 100.0000 mg | ORAL_TABLET | Freq: Two times a day (BID) | ORAL | Status: DC
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
## 2015-05-15 NOTE — Patient Instructions (Signed)
Thank you for coming to see [place] at [facility]. I hope we have been able to provide you high quality care today.  You may receive a patient satisfaction survey over the next few weeks. We would appreciate your feedback and comments so that we may continue to improve ourselves and the health of our patients.  - continue vimpat 134m twice a day - go to [email or web address].com for saving card / discount   ~~~~~~~~~~~~~~~~~~~~~~~~~~~~~~~~~~~~~~~~~~~~~~~~~~~~~~~~~~~~~~~~~  DR. PENUMALLI'S GUIDE TO HAPPY AND HEALTHY LIVING These are some of my general health and wellness recommendations. Some of them may apply to you better than others. Please use common sense as you try these suggestions and feel free to ask me any questions.   ACTIVITY/FITNESS Mental, social, emotional and physical stimulation are very important for brain and body health. Try learning a new activity (arts, music, language, sports, games).  Keep moving your body to the best of your abilities. You can do this at home, inside or outside, the park, community center, gym or anywhere you like. Consider a physical therapist or personal trainer to get started. Consider the app Sworkit. Fitness trackers such as smart-watches, smart-phones or Fitbits can help as well.   NUTRITION Eat more plants: colorful vegetables, nuts, seeds and berries.  Eat less sugar, salt, preservatives and processed foods.  Avoid toxins such as cigarettes and alcohol.  Drink water when you are thirsty. Warm water with a slice of lemon is an excellent morning drink to start the day.  Consider these websites for more information The Nutrition Source (hhttps://www.henry-hernandez.biz/ Precision Nutrition (wWindowBlog.ch   RELAXATION Consider practicing mindfulness meditation or other relaxation techniques such as deep breathing, prayer, yoga, tai chi, massage. See website mindful.org or the apps Headspace or  Calm to help get started.   SLEEP Try to get at least 7-8+ hours sleep per day. Regular exercise and reduced caffeine will help you sleep better. Practice good sleep hygeine techniques. See website sleep.org for more information.   PLANNING Prepare estate planning, living will, healthcare POA documents. Sometimes this is best planned with the help of an attorney. Theconversationproject.org and agingwithdignity.org are excellent resources.

## 2015-06-06 ENCOUNTER — Other Ambulatory Visit: Payer: Self-pay | Admitting: Diagnostic Neuroimaging

## 2015-06-06 NOTE — Telephone Encounter (Signed)
Pt called inquiring about refill for Vimpat. Sts he does not have written RX.

## 2015-06-06 NOTE — Telephone Encounter (Signed)
I called the pharmacy and spoke with Lynden Angathy,  She verified they do have the Rx on file, however, they did not have the medication in stock, so it has been ordered for arrival tomorrow.  I called the patient back to advise.  Got no answer.  Left message.

## 2015-06-09 ENCOUNTER — Telehealth: Payer: Self-pay | Admitting: Diagnostic Neuroimaging

## 2015-06-09 ENCOUNTER — Encounter: Payer: Self-pay | Admitting: *Deleted

## 2015-06-09 NOTE — Telephone Encounter (Signed)
Samples given to wife per Dr Marjory LiesPenumalli; documented.

## 2015-06-09 NOTE — Telephone Encounter (Signed)
Patient's wife is in the lobby requesting samples of Vimpat or an equivalent as he only has enough until Sunday. He is having issues with insurance picking up Rx and just needs samples to get him through the weekend. Best call back is [phone]

## 2015-06-09 NOTE — Progress Notes (Signed)
Wife in lobby requesting Vimpat samples for husband, states he is having insurance issues and will run out Sunday. She verified her identity with photo ID. Per Dr Marjory LiesPenumalli, gave her two weeks supply in samples, with instructions. She verbalized understanding.

## 2015-06-23 NOTE — Telephone Encounter (Signed)
Patient requested additional samples while waiting for insurance approval.  Two boxes of #14 each provided.

## 2015-07-12 ENCOUNTER — Telehealth: Payer: Self-pay | Admitting: Diagnostic Neuroimaging

## 2015-07-12 NOTE — Telephone Encounter (Signed)
Yes give samples and also see what is going on with insurance and coverage. -VRP

## 2015-07-12 NOTE — Telephone Encounter (Signed)
I called the pharmacy to see if they are getting any rejections from ins.  Spoke with EMCORCathy.  She said it did go through ins last month, and his co-pay was 62$70.  States they currently do not have a valid ins on file for him, so unfortunately, she was not able to provide any additional info.  Says if he will provide them with a new/current ins card they can try to reprocess the Rx.   I called the patient.  Got no answer.  Left message relaying info provided by the pharmacy.  Asked that he call us back if there is anything we can try to assist with.

## 2015-07-12 NOTE — Telephone Encounter (Signed)
Spoke with patient and informed him, per Dr Marjory LiesPenumalli, he may receive two more weeks of samples. However he needs to contact his insurance and also Vimpat assistance program so he can purchase medication in future. Will provide him copy of online information/instrucitons re: patient assistance and also give him Vimpat patient savings card. Informed patient of best time of day to pick up samples. Informed him this RN will also route note to Shanda BumpsJessica to see if she has any further information for him. He verbalized understanding, appreciation. Samples left at front desk for patient, 11:34 pm.

## 2015-07-12 NOTE — Telephone Encounter (Signed)
Patient presented to the lobby this morning requesting samples of Vimpat stating he was still having problems with insurance. Please call him on his cell at [phone].

## 2015-07-19 ENCOUNTER — Other Ambulatory Visit: Payer: BLUE CROSS/BLUE SHIELD

## 2015-07-26 ENCOUNTER — Encounter: Payer: BLUE CROSS/BLUE SHIELD | Admitting: Family Medicine

## 2015-07-27 ENCOUNTER — Telehealth: Payer: Self-pay | Admitting: Diagnostic Neuroimaging

## 2015-07-27 ENCOUNTER — Encounter: Payer: Self-pay | Admitting: *Deleted

## 2015-07-27 NOTE — Telephone Encounter (Signed)
pls give patient samples of vimpat to cover him for next 1 month. His insurance will re-start after the new year. Thanks. And please let patient and spouse know. -VRP

## 2015-07-27 NOTE — Telephone Encounter (Signed)
Dr Herminio HeadsShawn Dalton-Bonelli called requesting to speak with Dr Docia BarrierPenuamlli. Spouse sts sent an email this morning to provider.

## 2015-07-27 NOTE — Telephone Encounter (Signed)
Spoke with wife who states patient has enough Vimpat to last through 07/31/15. She states he will be able to get prescription filled on 08/13/2015, so a two week supply is sufficient. She then stated that her husband's job is requiring extra hours over his routine regular 40 hr week. She is requesting letter stating he must maintain regular 40 hr week schedule to prevent further episodes.  Informed her would get two week Vimpat samples ready and discuss letter with Dr Marjory LiesPenumalli. Advised she pick up after 12 noon today, before 5 pm. She verbalized understanding, appreciation.

## 2015-09-05 ENCOUNTER — Telehealth: Payer: Self-pay | Admitting: Family Medicine

## 2015-09-05 NOTE — Telephone Encounter (Signed)
FYI.  Home Care Advise Given 

## 2015-09-05 NOTE — Telephone Encounter (Signed)
TELEPHONE ADVICE RECORD [facility]  Patient Name: Brandon Campbell  DOB: 1971-02-18    Initial Comment caller states he may have pink eye   Nurse Assessment  Nurse: Odis Luster, RN, Bjorn Loser Date/Time Lamount Cohen Time): 09/05/2015 3:47:57 PM  Confirm and document reason for call. If symptomatic, describe symptoms. You must click the next button to save text entered. ---caller states he may have pink eye. Reports that he has a hx of hives and he has never had bags under his eyes. This morning his throat began to hurt, his eye began to swell. Has been exposed to Pink eye in the office. Denies itching, drainage from the eyes. Denies recent cold symptoms. Has been dealing with allergies. Reports both eyes are involved. The skin under the eye and the eyelid over the left eye is beginning to swell. Reports that he takes shots for allergies, and he has taken certizine today around 11am. No difference since taking this. Reports environmental and food allergies.  Has the patient traveled out of the country within the last 30 days? ---Not Applicable  Does the patient have any new or worsening symptoms? ---Yes  Will a triage be completed? ---Yes  Related visit to physician within the last 2 weeks? ---No  Does the PT have any chronic conditions? (i.e. diabetes, asthma, etc.) ---Yes  List chronic conditions. ---allergies, grand mal seizures (takes meds),  Is this a behavioral health or substance abuse call? ---No     Guidelines    Guideline Title Affirmed Question Affirmed Notes  Eye - Swelling Eyelid swelling from suspected mild irritant (all triage questions negative)    Final Disposition User   Home Care Odis Luster, RN, Bjorn Loser    Disagree/Comply: Danella Maiers

## 2015-11-13 ENCOUNTER — Ambulatory Visit (INDEPENDENT_AMBULATORY_CARE_PROVIDER_SITE_OTHER): Payer: Commercial Managed Care - PPO | Admitting: Diagnostic Neuroimaging

## 2015-11-13 ENCOUNTER — Encounter: Payer: Self-pay | Admitting: Diagnostic Neuroimaging

## 2015-11-13 VITALS — BP 142/101 | HR 65 | Ht 72.0 in | Wt 215.0 lb

## 2015-11-13 DIAGNOSIS — I159 Secondary hypertension, unspecified: Secondary | ICD-10-CM

## 2015-11-13 DIAGNOSIS — R0683 Snoring: Secondary | ICD-10-CM

## 2015-11-13 DIAGNOSIS — G40209 Localization-related (focal) (partial) symptomatic epilepsy and epileptic syndromes with complex partial seizures, not intractable, without status epilepticus: Secondary | ICD-10-CM | POA: Diagnosis not present

## 2015-11-13 MED ORDER — LACOSAMIDE 100 MG PO TABS: 100.0000 mg | ORAL_TABLET | Freq: Two times a day (BID) | ORAL | Status: DC
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
## 2015-11-13 NOTE — Patient Instructions (Signed)
Thank you for coming to see [place] at [facility]. I hope we have been able to provide you high quality care today.  You may receive a patient satisfaction survey over the next few weeks. We would appreciate your feedback and comments so that we may continue to improve ourselves and the health of our patients.  - continue vimpat 143m twice a day - consider sleep study - monitor BP at home; follow up with PCP   ~~~~~~~~~~~~~~~~~~~~~~~~~~~~~~~~~~~~~~~~~~~~~~~~~~~~~~~~~~~~~~~~~  DR. Maliik Karner'S GUIDE TO HAPPY AND HEALTHY LIVING These are some of my general health and wellness recommendations. Some of them may apply to you better than others. Please use common sense as you try these suggestions and feel free to ask me any questions.   ACTIVITY/FITNESS Mental, social, emotional and physical stimulation are very important for brain and body health. Try learning a new activity (arts, music, language, sports, games).  Keep moving your body to the best of your abilities. You can do this at home, inside or outside, the park, community center, gym or anywhere you like. Consider a physical therapist or personal trainer to get started. Consider the app Sworkit. Fitness trackers such as smart-watches, smart-phones or Fitbits can help as well.   NUTRITION Eat more plants: colorful vegetables, nuts, seeds and berries.  Eat less sugar, salt, preservatives and processed foods.  Avoid toxins such as cigarettes and alcohol.  Drink water when you are thirsty. Warm water with a slice of lemon is an excellent morning drink to start the day.  Consider these websites for more information The Nutrition Source (hhttps://www.henry-hernandez.biz/ Precision Nutrition (wWindowBlog.ch   RELAXATION Consider practicing mindfulness meditation or other relaxation techniques such as deep breathing, prayer, yoga, tai chi, massage. See website mindful.org or  the apps Headspace or Calm to help get started.   SLEEP Try to get at least 7-8+ hours sleep per day. Regular exercise and reduced caffeine will help you sleep better. Practice good sleep hygeine techniques. See website sleep.org for more information.   PLANNING Prepare estate planning, living will, healthcare POA documents. Sometimes this is best planned with the help of an attorney. Theconversationproject.org and agingwithdignity.org are excellent resources.

## 2015-12-11 ENCOUNTER — Telehealth: Payer: Self-pay | Admitting: *Deleted

## 2015-12-11 NOTE — Telephone Encounter (Signed)
Spoke with Asher MuirJamie , pharmacist [facility] and informed her Vimpat prescription was faxed on 11/13/15. She stated pharmacy never received fax. Gave her verbal order for Vimpat 100 mg tabs, take one tab (100 mg total) twice a day , disp #60, 5 additional refills. Asher MuirJamie verbalized understanding, stated pharmacy will fill.

## 2016-04-10 ENCOUNTER — Encounter: Payer: Self-pay | Admitting: Diagnostic Neuroimaging

## 2016-05-17 ENCOUNTER — Ambulatory Visit: Payer: Commercial Managed Care - PPO | Admitting: Diagnostic Neuroimaging

## 2016-05-28 ENCOUNTER — Encounter: Payer: Self-pay | Admitting: Diagnostic Neuroimaging

## 2016-05-28 ENCOUNTER — Ambulatory Visit (INDEPENDENT_AMBULATORY_CARE_PROVIDER_SITE_OTHER): Payer: Commercial Managed Care - PPO | Admitting: Diagnostic Neuroimaging

## 2016-05-28 VITALS — BP 143/96 | HR 68 | Wt 213.0 lb

## 2016-05-28 DIAGNOSIS — G40209 Localization-related (focal) (partial) symptomatic epilepsy and epileptic syndromes with complex partial seizures, not intractable, without status epilepticus: Secondary | ICD-10-CM

## 2016-05-28 DIAGNOSIS — R0683 Snoring: Secondary | ICD-10-CM

## 2016-05-28 MED ORDER — LACOSAMIDE 100 MG PO TABS: 100.0000 mg | ORAL_TABLET | Freq: Two times a day (BID) | ORAL | 5 refills | Status: DC
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
## 2016-07-12 ENCOUNTER — Other Ambulatory Visit: Payer: BLUE CROSS/BLUE SHIELD

## 2016-07-19 ENCOUNTER — Encounter: Payer: BLUE CROSS/BLUE SHIELD | Admitting: Family Medicine

## 2016-08-15 ENCOUNTER — Encounter: Payer: Self-pay | Admitting: Family Medicine

## 2016-09-17 ENCOUNTER — Ambulatory Visit (INDEPENDENT_AMBULATORY_CARE_PROVIDER_SITE_OTHER): Payer: Commercial Managed Care - PPO | Admitting: Family Medicine

## 2016-09-17 ENCOUNTER — Encounter: Payer: Self-pay | Admitting: Family Medicine

## 2016-09-17 VITALS — BP 133/99 | HR 63 | Temp 98.0°F | Ht 72.0 in | Wt 215.0 lb

## 2016-09-17 DIAGNOSIS — Z0000 Encounter for general adult medical examination without abnormal findings: Secondary | ICD-10-CM | POA: Diagnosis not present

## 2016-09-17 DIAGNOSIS — E039 Hypothyroidism, unspecified: Secondary | ICD-10-CM | POA: Diagnosis not present

## 2016-09-17 LAB — CBC WITH DIFFERENTIAL/PLATELET
BASOS ABS: 0 10*3/uL (ref 0.0–0.1)
Basophils Relative: 0.8 % (ref 0.0–3.0)
Eosinophils Absolute: 0.2 10*3/uL (ref 0.0–0.7)
Eosinophils Relative: 3.1 % (ref 0.0–5.0)
HCT: 39.2 % (ref 39.0–52.0)
HEMOGLOBIN: 13.6 g/dL (ref 13.0–17.0)
LYMPHS ABS: 1.9 10*3/uL (ref 0.7–4.0)
Lymphocytes Relative: 36.1 % (ref 12.0–46.0)
MCHC: 34.8 g/dL (ref 30.0–36.0)
MCV: 93.3 fl (ref 78.0–100.0)
MONO ABS: 0.4 10*3/uL (ref 0.1–1.0)
Monocytes Relative: 7.7 % (ref 3.0–12.0)
NEUTROS PCT: 52.3 % (ref 43.0–77.0)
Neutro Abs: 2.8 10*3/uL (ref 1.4–7.7)
Platelets: 325 10*3/uL (ref 150.0–400.0)
RBC: 4.2 Mil/uL — ABNORMAL LOW (ref 4.22–5.81)
RDW: 13.5 % (ref 11.5–15.5)
WBC: 5.4 10*3/uL (ref 4.0–10.5)

## 2016-09-17 LAB — BASIC METABOLIC PANEL
BUN: 19 mg/dL (ref 6–23)
CALCIUM: 9.8 mg/dL (ref 8.4–10.5)
CHLORIDE: 107 meq/L (ref 96–112)
CO2: 28 mEq/L (ref 19–32)
CREATININE: 1.25 mg/dL (ref 0.40–1.50)
GFR: 80.64 mL/min (ref >60.00)
Glucose, Bld: 97 mg/dL (ref 70–99)
Potassium: 4.1 mEq/L (ref 3.5–5.1)
Sodium: 140 mEq/L (ref 135–145)

## 2016-09-17 LAB — HEPATIC FUNCTION PANEL
ALBUMIN: 4.4 g/dL (ref 3.5–5.2)
ALT: 52 U/L (ref 0–53)
AST: 49 U/L — AB (ref 0–37)
Alkaline Phosphatase: 94 U/L (ref 39–117)
Bilirubin, Direct: 0.1 mg/dL (ref 0.0–0.3)
Total Bilirubin: 0.7 mg/dL (ref 0.2–1.2)
Total Protein: 6.9 g/dL (ref 6.0–8.3)

## 2016-09-17 LAB — PSA: PSA: 0.82 ng/mL (ref 0.10–4.00)

## 2016-09-17 LAB — TSH: TSH: 1.31 u[IU]/mL (ref 0.35–4.50)

## 2016-09-17 LAB — T4, FREE: Free T4: 0.57 ng/dL — ABNORMAL LOW (ref 0.60–1.60)

## 2016-09-17 LAB — T3, FREE: T3 FREE: 5.3 pg/mL — AB (ref 2.3–4.2)

## 2016-09-17 NOTE — Progress Notes (Signed)
Pre visit review using our clinic review tool, if applicable. No additional management support is needed unless otherwise documented below in the visit note. 

## 2016-09-17 NOTE — Progress Notes (Signed)
   Subjective:    Patient ID: Brandon Campbell, male    DOB: 03/11/1971, 46 y.o.   MRN: [id number]  HPI 46 yr old male for a well exam. He feels fine except for his usual allergies. He admits to eating an unhealthy diet. He had some labs drawn recently that he brought with him today. His lipids were remarkable for an LDL of 170.    Review of Systems  Constitutional: Negative.   HENT: Negative.   Eyes: Negative.   Respiratory: Negative.   Cardiovascular: Negative.   Gastrointestinal: Negative.   Genitourinary: Negative.   Musculoskeletal: Negative.   Skin: Negative.   Neurological: Negative.   Psychiatric/Behavioral: Negative.        Objective:   Physical Exam  Constitutional: He is oriented to person, place, and time. He appears well-developed and well-nourished. No distress.  HENT:  Head: Normocephalic and atraumatic.  Right Ear: External ear normal.  Left Ear: External ear normal.  Nose: Nose normal.  Mouth/Throat: Oropharynx is clear and moist. No oropharyngeal exudate.  Eyes: Conjunctivae and EOM are normal. Pupils are equal, round, and reactive to light. Right eye exhibits no discharge. Left eye exhibits no discharge. No scleral icterus.  Neck: Neck supple. No JVD present. No tracheal deviation present. No thyromegaly present.  Cardiovascular: Normal rate, regular rhythm, normal heart sounds and intact distal pulses.  Exam reveals no gallop and no friction rub.   No murmur heard. Pulmonary/Chest: Effort normal and breath sounds normal. No respiratory distress. He has no wheezes. He has no rales. He exhibits no tenderness.  Abdominal: Soft. Bowel sounds are normal. He exhibits no distension and no mass. There is no tenderness. There is no rebound and no guarding.  Genitourinary: Rectum normal, prostate normal and penis normal. Rectal exam shows guaiac negative stool. No penile tenderness.  Musculoskeletal: Normal range of motion. He exhibits no edema or tenderness.    Lymphadenopathy:    He has no cervical adenopathy.  Neurological: He is alert and oriented to person, place, and time. He has normal reflexes. No cranial nerve deficit. He exhibits normal muscle tone. Coordination normal.  Skin: Skin is warm and dry. No rash noted. He is not diaphoretic. No erythema. No pallor.  Psychiatric: He has a normal mood and affect. His behavior is normal. Judgment and thought content normal.          Assessment & Plan:  Well exam. We discussed diet and exercise. He will try to limit the fatty foods in his diet. Get more labs today including a full thyroid panel.  Gershon CraneStephen Fry, MD

## 2016-09-23 ENCOUNTER — Telehealth: Payer: Self-pay | Admitting: Family Medicine

## 2016-09-23 NOTE — Telephone Encounter (Signed)
Pt would like you to mail a copy of his labs.   Pt declined to sign up for mychart at this time.

## 2016-09-24 NOTE — Telephone Encounter (Signed)
I printed a copy of lab results and put in the mail to pt.

## 2016-11-26 ENCOUNTER — Ambulatory Visit: Payer: Commercial Managed Care - PPO | Admitting: Diagnostic Neuroimaging

## 2016-12-10 ENCOUNTER — Encounter: Payer: Self-pay | Admitting: Diagnostic Neuroimaging

## 2016-12-10 ENCOUNTER — Other Ambulatory Visit: Payer: Self-pay | Admitting: Diagnostic Neuroimaging

## 2016-12-10 ENCOUNTER — Ambulatory Visit (INDEPENDENT_AMBULATORY_CARE_PROVIDER_SITE_OTHER): Payer: Commercial Managed Care - PPO | Admitting: Diagnostic Neuroimaging

## 2016-12-10 ENCOUNTER — Encounter (INDEPENDENT_AMBULATORY_CARE_PROVIDER_SITE_OTHER): Payer: Self-pay

## 2016-12-10 VITALS — BP 138/91 | HR 58 | Ht 72.0 in | Wt 203.0 lb

## 2016-12-10 DIAGNOSIS — G40209 Localization-related (focal) (partial) symptomatic epilepsy and epileptic syndromes with complex partial seizures, not intractable, without status epilepticus: Secondary | ICD-10-CM | POA: Diagnosis not present

## 2016-12-10 DIAGNOSIS — I10 Essential (primary) hypertension: Secondary | ICD-10-CM | POA: Diagnosis not present

## 2016-12-10 DIAGNOSIS — R0683 Snoring: Secondary | ICD-10-CM | POA: Diagnosis not present

## 2016-12-10 MED ORDER — LACOSAMIDE 100 MG PO TABS: 100.0000 mg | ORAL_TABLET | Freq: Two times a day (BID) | ORAL | 5 refills | Status: DC
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
## 2016-12-10 NOTE — Patient Instructions (Signed)
-   continue vimpat  twice a day

## 2017-06-11 ENCOUNTER — Ambulatory Visit: Payer: Commercial Managed Care - PPO | Admitting: Diagnostic Neuroimaging

## 2017-06-14 ENCOUNTER — Other Ambulatory Visit: Payer: Self-pay | Admitting: Diagnostic Neuroimaging

## 2017-06-17 NOTE — Telephone Encounter (Signed)
Fax confirmation received vimpat  [facility] [phone].

## 2017-07-14 ENCOUNTER — Encounter: Payer: Self-pay | Admitting: Diagnostic Neuroimaging

## 2017-07-14 ENCOUNTER — Encounter (INDEPENDENT_AMBULATORY_CARE_PROVIDER_SITE_OTHER): Payer: Self-pay

## 2017-07-14 ENCOUNTER — Ambulatory Visit: Payer: Commercial Managed Care - PPO | Admitting: Diagnostic Neuroimaging

## 2017-07-14 VITALS — BP 144/97 | HR 60 | Wt 209.8 lb

## 2017-07-14 DIAGNOSIS — G40209 Localization-related (focal) (partial) symptomatic epilepsy and epileptic syndromes with complex partial seizures, not intractable, without status epilepticus: Secondary | ICD-10-CM

## 2017-07-14 MED ORDER — LACOSAMIDE 100 MG PO TABS: 1.0000 | ORAL_TABLET | Freq: Two times a day (BID) | ORAL | 5 refills | Status: DC
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
## 2017-07-14 NOTE — Progress Notes (Signed)
[facility] [address]

## 2017-08-01 DIAGNOSIS — L501 Idiopathic urticaria: Secondary | ICD-10-CM | POA: Diagnosis not present

## 2017-09-19 ENCOUNTER — Encounter: Payer: Self-pay | Admitting: Family Medicine

## 2017-09-19 ENCOUNTER — Ambulatory Visit (INDEPENDENT_AMBULATORY_CARE_PROVIDER_SITE_OTHER): Payer: Commercial Managed Care - PPO | Admitting: Family Medicine

## 2017-09-19 VITALS — BP 120/72 | HR 70 | Temp 98.2°F | Ht 73.0 in | Wt 199.8 lb

## 2017-09-19 DIAGNOSIS — Z0000 Encounter for general adult medical examination without abnormal findings: Secondary | ICD-10-CM | POA: Diagnosis not present

## 2017-09-19 LAB — CBC WITH DIFFERENTIAL/PLATELET
Basophils Absolute: 52 cells/uL (ref 0–200)
Basophils Relative: 1 %
EOS PCT: 3.1 %
Eosinophils Absolute: 161 cells/uL (ref 15–500)
HCT: 38.8 % (ref 38.5–50.0)
HEMOGLOBIN: 13.5 g/dL (ref 13.2–17.1)
Lymphs Abs: 1846 cells/uL (ref 850–3900)
MCH: 31.5 pg (ref 27.0–33.0)
MCHC: 34.8 g/dL (ref 32.0–36.0)
MCV: 90.7 fL (ref 80.0–100.0)
MONOS PCT: 8.1 %
MPV: 9.8 fL (ref 7.5–12.5)
NEUTROS ABS: 2720 {cells}/uL (ref 1500–7800)
Neutrophils Relative %: 52.3 %
Platelets: 306 10*3/uL (ref 140–400)
RBC: 4.28 10*6/uL (ref 4.20–5.80)
RDW: 12.5 % (ref 11.0–15.0)
Total Lymphocyte: 35.5 %
WBC mixed population: 421 cells/uL (ref 200–950)
WBC: 5.2 10*3/uL (ref 3.8–10.8)

## 2017-09-19 LAB — POC URINALSYSI DIPSTICK (AUTOMATED)
Bilirubin, UA: NEGATIVE
Blood, UA: NEGATIVE
Glucose, UA: NEGATIVE
KETONES UA: NEGATIVE
LEUKOCYTES UA: NEGATIVE
NITRITE UA: NEGATIVE
PH UA: 6 (ref 5.0–8.0)
PROTEIN UA: NEGATIVE
Spec Grav, UA: 1.01 (ref 1.010–1.025)
UROBILINOGEN UA: 0.2 U/dL

## 2017-09-19 LAB — BASIC METABOLIC PANEL
BUN: 13 mg/dL (ref 6–23)
CALCIUM: 9.5 mg/dL (ref 8.4–10.5)
CO2: 27 mEq/L (ref 19–32)
CREATININE: 1.34 mg/dL (ref 0.40–1.50)
Chloride: 104 mEq/L (ref 96–112)
GFR: 74.08 mL/min (ref >60.00)
Glucose, Bld: 88 mg/dL (ref 70–99)
Potassium: 3.8 mEq/L (ref 3.5–5.1)
Sodium: 138 mEq/L (ref 135–145)

## 2017-09-19 LAB — HEPATIC FUNCTION PANEL
ALT: 24 U/L (ref 0–53)
AST: 18 U/L (ref 0–37)
Albumin: 4.4 g/dL (ref 3.5–5.2)
Alkaline Phosphatase: 81 U/L (ref 39–117)
Bilirubin, Direct: 0.2 mg/dL (ref 0.0–0.3)
Total Bilirubin: 1.1 mg/dL (ref 0.2–1.2)
Total Protein: 6.8 g/dL (ref 6.0–8.3)

## 2017-09-19 LAB — LIPID PANEL
CHOL/HDL RATIO: 4
Cholesterol: 184 mg/dL (ref 0–200)
HDL: 42 mg/dL (ref >39.00)
LDL CALC: 129 mg/dL — AB (ref 0–99)
NonHDL: 141.53
Triglycerides: 61 mg/dL (ref 0.0–149.0)
VLDL: 12.2 mg/dL (ref 0.0–40.0)

## 2017-09-19 LAB — PSA: PSA: 0.75 ng/mL (ref 0.10–4.00)

## 2017-09-19 LAB — TSH: TSH: 1.4 u[IU]/mL (ref 0.35–4.50)

## 2017-09-19 NOTE — Progress Notes (Signed)
   Subjective:    Patient ID: Elder CyphersChristopher J Jeppsen, male    DOB: 20-Aug-1970, 47 y.o.   MRN: [id number]  HPI Here for a well exam. He feels great.    Review of Systems  Constitutional: Negative.   HENT: Negative.   Eyes: Negative.   Respiratory: Negative.   Cardiovascular: Negative.   Gastrointestinal: Negative.   Genitourinary: Negative.   Musculoskeletal: Negative.   Skin: Negative.   Neurological: Negative.   Psychiatric/Behavioral: Negative.        Objective:   Physical Exam  Constitutional: He is oriented to person, place, and time. He appears well-developed and well-nourished. No distress.  HENT:  Head: Normocephalic and atraumatic.  Right Ear: External ear normal.  Left Ear: External ear normal.  Nose: Nose normal.  Mouth/Throat: Oropharynx is clear and moist. No oropharyngeal exudate.  Eyes: Conjunctivae and EOM are normal. Pupils are equal, round, and reactive to light. Right eye exhibits no discharge. Left eye exhibits no discharge. No scleral icterus.  Neck: Neck supple. No JVD present. No tracheal deviation present. No thyromegaly present.  Cardiovascular: Normal rate, regular rhythm, normal heart sounds and intact distal pulses. Exam reveals no gallop and no friction rub.  No murmur heard. Pulmonary/Chest: Effort normal and breath sounds normal. No respiratory distress. He has no wheezes. He has no rales. He exhibits no tenderness.  Abdominal: Soft. Bowel sounds are normal. He exhibits no distension and no mass. There is no tenderness. There is no rebound and no guarding.  Genitourinary: Rectum normal, prostate normal and penis normal. Rectal exam shows guaiac negative stool. No penile tenderness.  Musculoskeletal: Normal range of motion. He exhibits no edema or tenderness.  Lymphadenopathy:    He has no cervical adenopathy.  Neurological: He is alert and oriented to person, place, and time. He has normal reflexes. No cranial nerve deficit. He exhibits normal  muscle tone. Coordination normal.  Skin: Skin is warm and dry. No rash noted. He is not diaphoretic. No erythema. No pallor.  Psychiatric: He has a normal mood and affect. His behavior is normal. Judgment and thought content normal.          Assessment & Plan:  Well exam. We discussed diet and exercise. Get fasting labs.  Gershon CraneStephen Fry, MD

## 2017-09-26 DIAGNOSIS — L509 Urticaria, unspecified: Secondary | ICD-10-CM | POA: Diagnosis not present

## 2017-10-24 DIAGNOSIS — L501 Idiopathic urticaria: Secondary | ICD-10-CM | POA: Diagnosis not present

## 2017-12-17 ENCOUNTER — Ambulatory Visit (INDEPENDENT_AMBULATORY_CARE_PROVIDER_SITE_OTHER)
Admission: RE | Admit: 2017-12-17 | Discharge: 2017-12-17 | Disposition: A | Discharge status: Home / Self Care | Payer: Commercial Managed Care - PPO | Source: Ambulatory Visit | Attending: Adult Health | Admitting: Adult Health

## 2017-12-17 ENCOUNTER — Ambulatory Visit: Payer: Commercial Managed Care - PPO | Admitting: Adult Health

## 2017-12-17 ENCOUNTER — Encounter: Payer: Self-pay | Admitting: Adult Health

## 2017-12-17 ENCOUNTER — Ambulatory Visit: Payer: Self-pay

## 2017-12-17 VITALS — BP 126/90 | Temp 97.6°F | Wt 207.0 lb

## 2017-12-17 DIAGNOSIS — R079 Chest pain, unspecified: Secondary | ICD-10-CM

## 2017-12-17 NOTE — Telephone Encounter (Signed)
Pt. States he has a cold "about 1 month ago " and noticed some chest pain with sneezing. His cold is gone ,but has continued with chest pain upper left side that sometimes radiates to below his left shoulder. Denies shortness of breath. States he has pain with deep breathing,sneezing and certain movements. No other symptoms. Appointment made. Reason for Disposition . [1] Chest pain lasting <= 5 minutes AND [2] NO chest pain or cardiac symptoms now(Exceptions: pains lasting a few seconds)  Answer Assessment - Initial Assessment Questions 1. LOCATION: "Where does it hurt?"       Upper left side 2. RADIATION: "Does the pain go anywhere else?" (e.g., into neck, jaw, arms, back)     Back - below the shoulder 3. ONSET: "When did the chest pain begin?" (Minutes, hours or days)      Started last month when pt. Had a cold 4. PATTERN "Does the pain come and go, or has it been constant since it started?"  "Does it get worse with exertion?"      Comes and goes - hurts with a deep breath or when sneezing 5. DURATION: "How long does it last" (e.g., seconds, minutes, hours)     A few seconds 6. SEVERITY: "How bad is the pain?"  (e.g., Scale 1-10; mild, moderate, or severe)    - MILD (1-3): doesn't interfere with normal activities     - MODERATE (4-7): interferes with normal activities or awakens from sleep    - SEVERE (8-10): excruciating pain, unable to do any normal activities       9 7. CARDIAC RISK FACTORS: "Do you have any history of heart problems or risk factors for heart disease?" (e.g., prior heart attack, angina; high blood pressure, diabetes, being overweight, high cholesterol, smoking, or strong family history of heart disease)     No 8. PULMONARY RISK FACTORS: "Do you have any history of lung disease?"  (e.g., blood clots in lung, asthma, emphysema, birth control pills)     No 9. CAUSE: "What do you think is causing the chest pain?"     Unsure 10. OTHER SYMPTOMS: "Do you have any other  symptoms?" (e.g., dizziness, nausea, vomiting, sweating, fever, difficulty breathing, cough)       No 11. PREGNANCY: "Is there any chance you are pregnant?" "When was your last menstrual period?"       No  Protocols used: CHEST PAIN-A-AH

## 2017-12-17 NOTE — Telephone Encounter (Signed)
Confirmed patient has arrived here in the office to see [facility] NP.

## 2017-12-17 NOTE — Telephone Encounter (Signed)
Monitor for office arrival 
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
## 2017-12-22 ENCOUNTER — Telehealth: Payer: Self-pay | Admitting: Family Medicine

## 2017-12-22 NOTE — Telephone Encounter (Signed)
Left  Message on VM to call back for  result notes

## 2017-12-22 NOTE — Telephone Encounter (Signed)
Copied from CRM [id number]. Topic: Quick Communication - Other Results >> Dec 18, 2017  9:29 AM Raj Janus T, CMA wrote: Called patient to inform them of x-ray result from 12/17/17. When patient returns call, triage nurse may disclose results. [phone]

## 2017-12-22 NOTE — Telephone Encounter (Signed)
See result notes. 

## 2018-01-05 ENCOUNTER — Other Ambulatory Visit: Payer: Self-pay | Admitting: Diagnostic Neuroimaging

## 2018-01-06 NOTE — Telephone Encounter (Signed)
Has appt with CM/NP 04-14-18.

## 2018-02-20 DIAGNOSIS — L509 Urticaria, unspecified: Secondary | ICD-10-CM | POA: Diagnosis not present
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
## 2018-04-14 ENCOUNTER — Ambulatory Visit: Payer: Commercial Managed Care - PPO | Admitting: Nurse Practitioner

## 2018-04-14 ENCOUNTER — Other Ambulatory Visit: Payer: Self-pay

## 2018-04-14 ENCOUNTER — Encounter: Payer: Self-pay | Admitting: Nurse Practitioner

## 2018-04-14 VITALS — BP 131/78 | HR 59 | Ht 73.0 in | Wt 208.0 lb

## 2018-04-14 DIAGNOSIS — R0683 Snoring: Secondary | ICD-10-CM | POA: Diagnosis not present

## 2018-04-14 DIAGNOSIS — R569 Unspecified convulsions: Secondary | ICD-10-CM | POA: Diagnosis not present

## 2018-04-14 MED ORDER — LACOSAMIDE 100 MG PO TABS: 1.0000 | ORAL_TABLET | Freq: Two times a day (BID) | ORAL | 5 refills | Status: DC

## 2018-04-14 NOTE — Patient Instructions (Addendum)
Continue vimpat 100mg  twice  - Will order sleep study  F/U yearly and prn  I explained in particular the risks and ramifications of untreated moderate to severe OSA, especially with respect to cardiovascular disease  including congestive heart failure, difficult to treat hypertension, cardiac arrhythmias, or stroke. Even type 2 diabetes has, in part, been linked to untreated OSA. Symptoms of untreated OSA include daytime sleepiness, memory problems, mood irritability and mood disorder such as depression and anxiety, lack of energy, as well as recurrent headaches, especially morning headaches.

## 2018-04-14 NOTE — Progress Notes (Signed)
I reviewed note and agree with plan.   VIKRAM R. PENUMALLI, MD  Certified in Neurology, Neurophysiology and Neuroimaging  [facility] [address], [address] [place], [place] [address] [phone]   

## 2018-04-28 DIAGNOSIS — M549 Dorsalgia, unspecified: Secondary | ICD-10-CM | POA: Diagnosis not present

## 2018-04-28 DIAGNOSIS — M546 Pain in thoracic spine: Secondary | ICD-10-CM | POA: Diagnosis not present

## 2018-04-28 DIAGNOSIS — M542 Cervicalgia: Secondary | ICD-10-CM | POA: Diagnosis not present

## 2018-05-15 DIAGNOSIS — L509 Urticaria, unspecified: Secondary | ICD-10-CM | POA: Diagnosis not present

## 2018-07-11 ENCOUNTER — Other Ambulatory Visit: Payer: Self-pay | Admitting: Diagnostic Neuroimaging

## 2018-08-07 DIAGNOSIS — L501 Idiopathic urticaria: Secondary | ICD-10-CM | POA: Diagnosis not present

## 2018-08-07 DIAGNOSIS — L853 Xerosis cutis: Secondary | ICD-10-CM | POA: Diagnosis not present

## 2018-08-21 DIAGNOSIS — L501 Idiopathic urticaria: Secondary | ICD-10-CM | POA: Diagnosis not present

## 2018-10-06 DIAGNOSIS — R748 Abnormal levels of other serum enzymes: Secondary | ICD-10-CM | POA: Diagnosis not present

## 2018-10-06 DIAGNOSIS — R946 Abnormal results of thyroid function studies: Secondary | ICD-10-CM | POA: Diagnosis not present

## 2018-10-06 DIAGNOSIS — R945 Abnormal results of liver function studies: Secondary | ICD-10-CM | POA: Diagnosis not present

## 2018-10-23 DIAGNOSIS — Z7689 Persons encountering health services in other specified circumstances: Secondary | ICD-10-CM | POA: Diagnosis not present

## 2018-10-23 DIAGNOSIS — L501 Idiopathic urticaria: Secondary | ICD-10-CM | POA: Diagnosis not present

## 2018-11-20 DIAGNOSIS — Z7689 Persons encountering health services in other specified circumstances: Secondary | ICD-10-CM | POA: Diagnosis not present

## 2018-11-20 DIAGNOSIS — L501 Idiopathic urticaria: Secondary | ICD-10-CM | POA: Diagnosis not present

## 2018-12-18 DIAGNOSIS — L501 Idiopathic urticaria: Secondary | ICD-10-CM | POA: Diagnosis not present

## 2018-12-18 DIAGNOSIS — Z7689 Persons encountering health services in other specified circumstances: Secondary | ICD-10-CM | POA: Diagnosis not present

## 2019-01-13 ENCOUNTER — Other Ambulatory Visit: Payer: Self-pay | Admitting: *Deleted

## 2019-01-13 MED ORDER — LACOSAMIDE 100 MG PO TABS: 1.0000 | ORAL_TABLET | Freq: Two times a day (BID) | ORAL | 5 refills | Status: DC

## 2019-01-13 NOTE — Telephone Encounter (Signed)
Received fax from [facility] requesting refills on Vimpat. I called patient and LVM advising he needs to call and schedule his 1 year FU. He was last seen Sept 2019.

## 2019-01-13 NOTE — Addendum Note (Signed)
Addended by: [place] Pink on: 01/13/2019 01:51 PM   Modules accepted: Orders

## 2019-01-13 NOTE — Telephone Encounter (Signed)
pts appt has been set

## 2019-01-14 ENCOUNTER — Telehealth: Payer: Self-pay | Admitting: *Deleted

## 2019-01-14 ENCOUNTER — Other Ambulatory Visit: Payer: Self-pay | Admitting: *Deleted

## 2019-01-14 MED ORDER — LACOSAMIDE 100 MG PO TABS: 1.0000 | ORAL_TABLET | Freq: Two times a day (BID) | ORAL | 5 refills | Status: DC

## 2019-01-14 NOTE — Telephone Encounter (Signed)
Patient came into office stating he called [facility] for vimpat refill and was told it was never received. Per chart, Rx printed however it is not on printer. [facility], spoke with Arnethin and she was able to take a verbal refill. Gave her exact Rx that Dr Marjory Lies sent yesterday.  She  verbalized understanding, appreciation.

## 2019-03-16 ENCOUNTER — Other Ambulatory Visit: Payer: Self-pay | Admitting: Diagnostic Neuroimaging

## 2019-03-16 DIAGNOSIS — G4719 Other hypersomnia: Secondary | ICD-10-CM

## 2019-03-17 ENCOUNTER — Telehealth: Payer: Self-pay | Admitting: Neurology

## 2019-03-17 NOTE — Telephone Encounter (Signed)
Pt understands that although there may be some limitations with this type of visit, we will take all precautions to reduce any security or privacy concerns.  Pt understands that this will be treated like an in office visit and we will file with pt's insurance, and there may be a patient responsible charge related to this service.  Pt will be using my chart for the virtual visit. I have sent the pt the link through text message. I verbally gave the pt the activation code.  Pt was informed to have my chart downloaded and ready to access prior to appt.  Pt was informed that the nurse will be calling to go over chart prior to appt.

## 2019-03-19 NOTE — Progress Notes (Signed)
Please let me know when he is on my schedule, I already have had opportunity to ask  sleep related questions by phone/ text. CD

## 2019-03-22 ENCOUNTER — Telehealth: Payer: Self-pay | Admitting: Diagnostic Neuroimaging

## 2019-03-22 NOTE — Telephone Encounter (Signed)
Patient in the lobby requesting samples of Vimpat. Best call back is [phone]

## 2019-03-22 NOTE — Telephone Encounter (Signed)
Patient in office stating [facility] is out of Vimpmat 100 mg tabs. He Is requesting samples. Per Dr Leta Baptist gave him 2 boxes with 14 tabs each of Vimpat 100 mg tabs.

## 2019-03-30 ENCOUNTER — Telehealth: Payer: Self-pay | Admitting: Diagnostic Neuroimaging

## 2019-03-30 NOTE — Telephone Encounter (Signed)
I called patient and LVM to r/s 9/14 appointment due to Dr. Leta Baptist time off. If patient calls back patient can be scheduled with Amy NP per [facility].

## 2019-04-05 ENCOUNTER — Encounter: Payer: Self-pay | Admitting: Neurology

## 2019-04-06 ENCOUNTER — Encounter: Payer: Self-pay | Admitting: Neurology

## 2019-04-06 ENCOUNTER — Telehealth (INDEPENDENT_AMBULATORY_CARE_PROVIDER_SITE_OTHER): Payer: Commercial Managed Care - PPO | Admitting: Neurology

## 2019-04-06 DIAGNOSIS — G4719 Other hypersomnia: Secondary | ICD-10-CM

## 2019-04-06 DIAGNOSIS — R0683 Snoring: Secondary | ICD-10-CM

## 2019-04-06 DIAGNOSIS — G40209 Localization-related (focal) (partial) symptomatic epilepsy and epileptic syndromes with complex partial seizures, not intractable, without status epilepticus: Secondary | ICD-10-CM

## 2019-04-06 DIAGNOSIS — R569 Unspecified convulsions: Secondary | ICD-10-CM | POA: Diagnosis not present
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
## 2019-04-12 ENCOUNTER — Encounter: Payer: Self-pay | Admitting: Neurology

## 2019-04-22 ENCOUNTER — Ambulatory Visit (INDEPENDENT_AMBULATORY_CARE_PROVIDER_SITE_OTHER): Payer: Commercial Managed Care - PPO | Admitting: Neurology

## 2019-04-22 ENCOUNTER — Other Ambulatory Visit: Payer: Self-pay

## 2019-04-22 DIAGNOSIS — R569 Unspecified convulsions: Secondary | ICD-10-CM

## 2019-04-22 DIAGNOSIS — G4719 Other hypersomnia: Secondary | ICD-10-CM

## 2019-04-22 DIAGNOSIS — G4733 Obstructive sleep apnea (adult) (pediatric): Secondary | ICD-10-CM

## 2019-04-22 DIAGNOSIS — G40209 Localization-related (focal) (partial) symptomatic epilepsy and epileptic syndromes with complex partial seizures, not intractable, without status epilepticus: Secondary | ICD-10-CM

## 2019-04-22 DIAGNOSIS — R0683 Snoring: Secondary | ICD-10-CM

## 2019-04-26 ENCOUNTER — Ambulatory Visit: Payer: Commercial Managed Care - PPO | Admitting: Diagnostic Neuroimaging

## 2019-04-30 DIAGNOSIS — G4719 Other hypersomnia: Secondary | ICD-10-CM | POA: Insufficient documentation

## 2019-04-30 DIAGNOSIS — G40209 Localization-related (focal) (partial) symptomatic epilepsy and epileptic syndromes with complex partial seizures, not intractable, without status epilepticus: Secondary | ICD-10-CM | POA: Insufficient documentation

## 2019-04-30 NOTE — Addendum Note (Signed)
Addended by: Larey Seat on: 04/30/2019 03:19 PM   Modules accepted: Orders
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
## 2019-05-03 ENCOUNTER — Encounter: Payer: Self-pay | Admitting: Neurology

## 2019-05-03 ENCOUNTER — Telehealth: Payer: Self-pay | Admitting: Neurology

## 2019-05-03 NOTE — Telephone Encounter (Signed)
Called patient to discuss sleep study results. No answer at this time. LVM for the patient to call back.  I will send a mychart message also.  

## 2019-05-03 NOTE — Telephone Encounter (Signed)
-----   Message from Larey Seat, MD sent at 04/30/2019  3:19 PM EDT ----- Loud Snoring was noted.   IMPRESSION:   1. Mild Obstructive Sleep Apnea (OSA) was confirmed, at AHI of  8.0/h - but strongly accentuated by supine and REM sleep. Snoring  was loud while sleeping in supine.   RECOMMENDATIONS:   1. Advice auto CPAP titration to optimize therapy of mild apnea  and loud snoring- dental devices usually don't work well for REM  dependent apnea. Alternatively, consider avoiding supine sleep  and AHI will likely remain under 5/h. This will however not  eliminate all snoring.    2. No seizure activity noted, no parasomnia activity captured.  This still doesn't exclude night terrors, night mares or PTSD  diagnosis. I recommend to remain on Vimpat.   PS/ Casey-Please let me know if you are ready to try CPAP.

## 2019-05-06 NOTE — Telephone Encounter (Signed)
Called the patient for the 2nd time. There was no answer. LVM informing the patient I have attempted to reach out through mychart as well. LVM asking to call back or reply to message.

## 2019-05-10 ENCOUNTER — Encounter: Payer: Self-pay | Admitting: Neurology

## 2019-05-12 NOTE — Telephone Encounter (Signed)
I called pt. I advised pt that Dr. Brett Fairy reviewed their sleep study results and found that pt has sleep apnea. Dr. Brett Fairy recommends that pt starts auto CPAP or avoid sleeping on back. I reviewed PAP compliance expectations with the pt. I was able to explain what the process looks like for the patient to get set up on auto CPAP. Pt verbalized understanding of results. Pt had no questions at this time but was encouraged to call back if questions arise. Pt would like to discuss with his wife and call back with how he would like to move forward.  **If patient calls back and states he would like to move forward with auto CPAP. Please advise I will send an order for auto CPAP to [facility]. Their number is [phone].  Please also go ahead and schedule a follow up visit around the 61-90 day mark from the phone call returned. This can be with Md or NP, Hedwig Morton or Amy L, NP

## 2019-05-19 NOTE — Telephone Encounter (Signed)
Order sent to [facility] for the pt

## 2019-05-19 NOTE — Telephone Encounter (Signed)
Pt returned call and stated he would like to move forward with Auto CPAP, number provided and appt set. No call back needed

## 2019-05-24 ENCOUNTER — Other Ambulatory Visit: Payer: Self-pay

## 2019-05-24 ENCOUNTER — Ambulatory Visit: Payer: Commercial Managed Care - PPO | Admitting: Diagnostic Neuroimaging

## 2019-05-24 ENCOUNTER — Encounter: Payer: Self-pay | Admitting: Diagnostic Neuroimaging

## 2019-05-24 VITALS — BP 165/115 | HR 66 | Temp 97.8°F | Ht 72.0 in | Wt 204.6 lb

## 2019-05-24 DIAGNOSIS — R569 Unspecified convulsions: Secondary | ICD-10-CM

## 2019-05-24 MED ORDER — VIMPAT 150 MG PO TABS: 150.0000 mg | ORAL_TABLET | Freq: Two times a day (BID) | ORAL | 5 refills | Status: DC
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
## 2019-05-27 ENCOUNTER — Telehealth: Payer: Self-pay | Admitting: Diagnostic Neuroimaging

## 2019-05-27 NOTE — Telephone Encounter (Signed)
Called the patient to discuss further. No answer, LVM informing the patient that I will dc the upcoming apt with Amy Lomax since he is not planning to start CPAP. Advised the patient that the only alternative option that was mentioned in the recommendations was avoiding sleeping on his back. LVM with this information and encouraged the patient to call back with whatever questions he has.

## 2019-05-27 NOTE — Telephone Encounter (Signed)
Pt has called to inform that he no longer wants to go with the CPAP.  Pt is electing to go with the other option presented to him.  Please call

## 2019-06-08 ENCOUNTER — Ambulatory Visit (INDEPENDENT_AMBULATORY_CARE_PROVIDER_SITE_OTHER): Payer: Commercial Managed Care - PPO | Admitting: Family Medicine

## 2019-06-08 ENCOUNTER — Other Ambulatory Visit: Payer: Self-pay

## 2019-06-08 ENCOUNTER — Encounter: Payer: Self-pay | Admitting: Family Medicine

## 2019-06-08 VITALS — BP 142/88 | HR 70 | Temp 97.3°F | Ht 72.0 in | Wt 200.0 lb

## 2019-06-08 DIAGNOSIS — Z0000 Encounter for general adult medical examination without abnormal findings: Secondary | ICD-10-CM

## 2019-06-08 LAB — LIPID PANEL
Cholesterol: 206 mg/dL — ABNORMAL HIGH (ref 0–200)
HDL: 48.7 mg/dL (ref >39.00)
LDL Cholesterol: 134 mg/dL — ABNORMAL HIGH (ref 0–99)
NonHDL: 157.56
Total CHOL/HDL Ratio: 4
Triglycerides: 119 mg/dL (ref 0.0–149.0)
VLDL: 23.8 mg/dL (ref 0.0–40.0)

## 2019-06-08 LAB — BASIC METABOLIC PANEL
BUN: 13 mg/dL (ref 6–23)
CO2: 30 mEq/L (ref 19–32)
Calcium: 9.9 mg/dL (ref 8.4–10.5)
Chloride: 101 mEq/L (ref 96–112)
Creatinine, Ser: 1.4 mg/dL (ref 0.40–1.50)
GFR: 65.76 mL/min (ref >60.00)
Glucose, Bld: 86 mg/dL (ref 70–99)
Potassium: 3.4 mEq/L — ABNORMAL LOW (ref 3.5–5.1)
Sodium: 139 mEq/L (ref 135–145)

## 2019-06-08 LAB — POC URINALSYSI DIPSTICK (AUTOMATED)
Glucose, UA: NEGATIVE
Leukocytes, UA: NEGATIVE
Protein, UA: NEGATIVE
Spec Grav, UA: 1.01 (ref 1.010–1.025)
Urobilinogen, UA: 0.2 E.U./dL
pH, UA: 6.5 (ref 5.0–8.0)

## 2019-06-08 LAB — CBC WITH DIFFERENTIAL/PLATELET
Basophils Absolute: 0.1 10*3/uL (ref 0.0–0.1)
Basophils Relative: 1 % (ref 0.0–3.0)
Eosinophils Absolute: 0.2 10*3/uL (ref 0.0–0.7)
Eosinophils Relative: 2.9 % (ref 0.0–5.0)
HCT: 40.9 % (ref 39.0–52.0)
Hemoglobin: 14.1 g/dL (ref 13.0–17.0)
Lymphocytes Relative: 42.9 % (ref 12.0–46.0)
Lymphs Abs: 2.9 10*3/uL (ref 0.7–4.0)
MCHC: 34.5 g/dL (ref 30.0–36.0)
MCV: 93.6 fl (ref 78.0–100.0)
Monocytes Absolute: 0.5 10*3/uL (ref 0.1–1.0)
Monocytes Relative: 6.9 % (ref 3.0–12.0)
Neutro Abs: 3.1 10*3/uL (ref 1.4–7.7)
Neutrophils Relative %: 46.3 % (ref 43.0–77.0)
Platelets: 333 10*3/uL (ref 150.0–400.0)
RBC: 4.37 Mil/uL (ref 4.22–5.81)
RDW: 13.1 % (ref 11.5–15.5)
WBC: 6.7 10*3/uL (ref 4.0–10.5)

## 2019-06-08 LAB — HEPATIC FUNCTION PANEL
ALT: 24 U/L (ref 0–53)
AST: 20 U/L (ref 0–37)
Albumin: 4.9 g/dL (ref 3.5–5.2)
Alkaline Phosphatase: 111 U/L (ref 39–117)
Bilirubin, Direct: 0.2 mg/dL (ref 0.0–0.3)
Total Bilirubin: 0.8 mg/dL (ref 0.2–1.2)
Total Protein: 7.3 g/dL (ref 6.0–8.3)

## 2019-06-08 MED ORDER — LACOSAMIDE 200 MG PO TABS: 200.0000 mg | ORAL_TABLET | Freq: Two times a day (BID) | ORAL | 0 refills | Status: DC

## 2019-06-08 NOTE — Progress Notes (Signed)
Subjective:    Patient ID: Brandon Campbell, male    DOB: 09-09-1970, 48 y.o.   MRN: [id number]  HPI Here for a well exam. He feels well in general. He has had 2 recent seizures so Dr. Tish Frederickson has increased his dose of Vimpat. His urticaria is well controlled. He has had some borderline BP readings lately so he is watching his diet and trying to lose some weight.    Review of Systems  Constitutional: Negative.   HENT: Negative.   Eyes: Negative.   Respiratory: Negative.   Cardiovascular: Negative.   Gastrointestinal: Negative.   Genitourinary: Negative.   Musculoskeletal: Negative.   Skin: Negative.   Neurological: Positive for seizures.  Psychiatric/Behavioral: Negative.        Objective:   Physical Exam Constitutional:      General: He is not in acute distress.    Appearance: He is well-developed. He is not diaphoretic.  HENT:     Head: Normocephalic and atraumatic.     Right Ear: External ear normal.     Left Ear: External ear normal.     Nose: Nose normal.     Mouth/Throat:     Pharynx: No oropharyngeal exudate.  Eyes:     General: No scleral icterus.       Right eye: No discharge.        Left eye: No discharge.     Conjunctiva/sclera: Conjunctivae normal.     Pupils: Pupils are equal, round, and reactive to light.  Neck:     Musculoskeletal: Neck supple.     Thyroid: No thyromegaly.     Vascular: No JVD.     Trachea: No tracheal deviation.  Cardiovascular:     Rate and Rhythm: Normal rate and regular rhythm.     Heart sounds: Normal heart sounds. No murmur. No friction rub. No gallop.   Pulmonary:     Effort: Pulmonary effort is normal. No respiratory distress.     Breath sounds: Normal breath sounds. No wheezing or rales.  Chest:     Chest wall: No tenderness.  Abdominal:     General: Bowel sounds are normal. There is no distension.     Palpations: Abdomen is soft. There is no mass.     Tenderness: There is no abdominal tenderness. There is no  guarding or rebound.  Genitourinary:    Penis: Normal. No tenderness.      Prostate: Normal.     Rectum: Normal. Guaiac result negative.  Musculoskeletal: Normal range of motion.        General: No tenderness.  Lymphadenopathy:     Cervical: No cervical adenopathy.  Skin:    General: Skin is warm and dry.     Coloration: Skin is not pale.     Findings: No erythema or rash.  Neurological:     Mental Status: He is alert and oriented to person, place, and time.     Cranial Nerves: No cranial nerve deficit.     Motor: No abnormal muscle tone.     Coordination: Coordination normal.     Deep Tendon Reflexes: Reflexes are normal and symmetric. Reflexes normal.  Psychiatric:        Behavior: Behavior normal.        Thought Content: Thought content normal.        Judgment: Judgment normal.           Assessment & Plan:  Well exam. We discussed diet and exercise. Get fasting labs.  Annie Main  Sarajane Jews, MD

## 2019-06-10 LAB — PSA: PSA: 0.8 ng/mL (ref 0.10–4.00)

## 2019-06-10 LAB — TSH: TSH: 1.72 u[IU]/mL (ref 0.35–4.50)

## 2019-07-20 ENCOUNTER — Ambulatory Visit: Payer: Commercial Managed Care - PPO | Admitting: Family Medicine

## 2019-11-25 ENCOUNTER — Other Ambulatory Visit: Payer: Self-pay | Admitting: Diagnostic Neuroimaging

## 2019-11-29 ENCOUNTER — Other Ambulatory Visit: Payer: Self-pay | Admitting: *Deleted

## 2019-11-29 MED ORDER — LACOSAMIDE 200 MG PO TABS: 200.0000 mg | ORAL_TABLET | Freq: Two times a day (BID) | ORAL | 5 refills | Status: DC

## 2019-11-29 NOTE — Progress Notes (Signed)
Meds ordered this encounter  Medications  . lacosamide (VIMPAT) 200 MG TABS tablet    Sig: Take 1 tablet (200 mg total) by mouth 2 (two) times daily.    Dispense:  60 tablet    Refill:  5    Suanne Marker, MD 11/29/2019, 10:08 AM Certified in Neurology, Neurophysiology and Neuroimaging  [facility] [address] [place], [place] [address] [phone]

## 2019-12-01 ENCOUNTER — Telehealth: Payer: Self-pay | Admitting: *Deleted

## 2019-12-01 NOTE — Telephone Encounter (Signed)
Vimpat PA, key: [id number], G40.209, R56.9, had side effects from Keppra, on Vimpat since 10/2014. Reply: This medication may be excluded from the patient's benefit. For more information,  reach out to [facility] at [phone]. Drug is not covered by plan. Called [facility], spoke with Marcelle Smiling who stated he must try preferred drug; she is unable to give name of preferred drug, will have to call insurance.   Called patient and advised him insurance isn't approving Vimpat, requested he call back to discuss.

## 2019-12-01 NOTE — Telephone Encounter (Signed)
Pt has returned the call to Pincus Sanes, RN he is asking for a call back

## 2019-12-01 NOTE — Telephone Encounter (Addendum)
Called patient who stated that the wrong Rx was sent in yesterday. He has been on Vimpat 150 mg twice daily, the new Rx was for 200 mg tabs. I advised him his EMR shows his PCP refilled Vimpat 06/08/19 for 200 mg tabs. Patient stated he has never been on that dose. He has never had his insurance deny it.  He requested a new Rx be sent to [facility] for 150 mg tabs. I advised will sent to Dr Marjory Lies. Patient verbalized understanding, appreciation.

## 2019-12-02 ENCOUNTER — Other Ambulatory Visit: Payer: Self-pay | Admitting: Diagnostic Neuroimaging

## 2019-12-02 NOTE — Telephone Encounter (Signed)
Called [facility], spoke with Ave Filter, pharmacist and informed him of error in Vimpat 200 mg tab refill sent. I advised him per Dr Richrd Humbles 05/2019 note the patient is taking Vimpat 150 mg twice daily. Chandler deleted 200 mg Rx, sent request to refill Vimpat 150 mg tabs.

## 2019-12-28 ENCOUNTER — Telehealth: Payer: Self-pay | Admitting: Family Medicine

## 2019-12-28 NOTE — Telephone Encounter (Signed)
omalizumab Geoffry Paradise) 150 MG injection   [facility] [place], [place] - [address] [facility] Phone:  [phone]  Fax:  [phone]      Patient needs a PA for this medication

## 2019-12-29 ENCOUNTER — Ambulatory Visit: Payer: Commercial Managed Care - PPO | Attending: Internal Medicine

## 2019-12-29 DIAGNOSIS — Z20822 Contact with and (suspected) exposure to covid-19: Secondary | ICD-10-CM

## 2019-12-29 NOTE — Telephone Encounter (Signed)
Please advise, this was from a historical provider.

## 2019-12-29 NOTE — Telephone Encounter (Signed)
I do not prescribe this. He needs to ask the original prescriber

## 2019-12-29 NOTE — Telephone Encounter (Signed)
Left message for patient to call back  

## 2019-12-30 LAB — SARS-COV-2, NAA 2 DAY TAT

## 2019-12-30 LAB — NOVEL CORONAVIRUS, NAA: SARS-CoV-2, NAA: NOT DETECTED

## 2020-05-24 ENCOUNTER — Other Ambulatory Visit: Payer: Self-pay

## 2020-05-24 ENCOUNTER — Encounter: Payer: Self-pay | Admitting: Diagnostic Neuroimaging

## 2020-05-24 ENCOUNTER — Ambulatory Visit: Payer: Commercial Managed Care - PPO | Admitting: Diagnostic Neuroimaging

## 2020-05-24 VITALS — BP 154/84 | HR 64 | Ht 72.0 in | Wt 207.8 lb

## 2020-05-24 DIAGNOSIS — R569 Unspecified convulsions: Secondary | ICD-10-CM

## 2020-05-24 DIAGNOSIS — G40209 Localization-related (focal) (partial) symptomatic epilepsy and epileptic syndromes with complex partial seizures, not intractable, without status epilepticus: Secondary | ICD-10-CM | POA: Diagnosis not present

## 2020-05-24 MED ORDER — VIMPAT 150 MG PO TABS: 150.0000 mg | ORAL_TABLET | Freq: Two times a day (BID) | ORAL | 5 refills | Status: DC
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
## 2020-07-14 ENCOUNTER — Encounter: Payer: Commercial Managed Care - PPO | Admitting: Family Medicine

## 2020-07-18 ENCOUNTER — Other Ambulatory Visit: Payer: Self-pay

## 2020-07-18 ENCOUNTER — Ambulatory Visit (INDEPENDENT_AMBULATORY_CARE_PROVIDER_SITE_OTHER): Payer: Commercial Managed Care - PPO | Admitting: Family Medicine

## 2020-07-18 ENCOUNTER — Encounter: Payer: Self-pay | Admitting: Family Medicine

## 2020-07-18 VITALS — BP 148/92 | HR 55 | Temp 97.7°F | Ht 72.0 in | Wt 209.0 lb

## 2020-07-18 DIAGNOSIS — Z0000 Encounter for general adult medical examination without abnormal findings: Secondary | ICD-10-CM

## 2020-07-18 LAB — POCT URINALYSIS DIP (CLINITEK)
Bilirubin, UA: NEGATIVE
Blood, UA: NEGATIVE
Glucose, UA: NEGATIVE mg/dL
Ketones, POC UA: NEGATIVE mg/dL
Leukocytes, UA: NEGATIVE
Nitrite, UA: NEGATIVE
POC PROTEIN,UA: NEGATIVE
Spec Grav, UA: 1.015 (ref 1.010–1.025)
Urobilinogen, UA: 0.2 E.U./dL
pH, UA: 7 (ref 5.0–8.0)

## 2020-07-18 NOTE — Progress Notes (Signed)
   Subjective:    Patient ID: Brandon Campbell, male    DOB: January 20, 1971, 49 y.o.   MRN: [id number]  HPI Here for a well exam. He feels fine. His wife checks his BP at home regularly and it is always in the 120s or 130s over 70s or 80s.    Review of Systems  Constitutional: Negative.   HENT: Negative.   Eyes: Negative.   Respiratory: Negative.   Cardiovascular: Negative.   Gastrointestinal: Negative.   Genitourinary: Negative.   Musculoskeletal: Negative.   Skin: Negative.   Neurological: Negative.   Psychiatric/Behavioral: Negative.        Objective:   Physical Exam Constitutional:      General: He is not in acute distress.    Appearance: Normal appearance. He is well-developed. He is not diaphoretic.  HENT:     Head: Normocephalic and atraumatic.     Right Ear: External ear normal.     Left Ear: External ear normal.     Nose: Nose normal.     Mouth/Throat:     Pharynx: No oropharyngeal exudate.  Eyes:     General: No scleral icterus.       Right eye: No discharge.        Left eye: No discharge.     Conjunctiva/sclera: Conjunctivae normal.     Pupils: Pupils are equal, round, and reactive to light.  Neck:     Thyroid: No thyromegaly.     Vascular: No JVD.     Trachea: No tracheal deviation.  Cardiovascular:     Rate and Rhythm: Normal rate and regular rhythm.     Heart sounds: Normal heart sounds. No murmur heard.  No friction rub. No gallop.   Pulmonary:     Effort: Pulmonary effort is normal. No respiratory distress.     Breath sounds: Normal breath sounds. No wheezing or rales.  Chest:     Chest wall: No tenderness.  Abdominal:     General: Bowel sounds are normal. There is no distension.     Palpations: Abdomen is soft. There is no mass.     Tenderness: There is no abdominal tenderness. There is no guarding or rebound.  Genitourinary:    Penis: Normal. No tenderness.      Testes: Normal.     Prostate: Normal.     Rectum: Normal. Guaiac result  negative.  Musculoskeletal:        General: No tenderness. Normal range of motion.     Cervical back: Neck supple.  Lymphadenopathy:     Cervical: No cervical adenopathy.  Skin:    General: Skin is warm and dry.     Coloration: Skin is not pale.     Findings: No erythema or rash.  Neurological:     Mental Status: He is alert and oriented to person, place, and time.     Cranial Nerves: No cranial nerve deficit.     Motor: No abnormal muscle tone.     Coordination: Coordination normal.     Deep Tendon Reflexes: Reflexes are normal and symmetric. Reflexes normal.  Psychiatric:        Behavior: Behavior normal.        Thought Content: Thought content normal.        Judgment: Judgment normal.           Assessment & Plan:  Well exam. We discussed diet and exercise. Get fasting labs.  Gershon Crane, MD

## 2020-07-19 LAB — HEPATIC FUNCTION PANEL
AG Ratio: 1.6 (calc) (ref 1.0–2.5)
ALT: 54 U/L — ABNORMAL HIGH (ref 9–46)
AST: 25 U/L (ref 10–40)
Albumin: 4.6 g/dL (ref 3.6–5.1)
Alkaline phosphatase (APISO): 158 U/L — ABNORMAL HIGH (ref 36–130)
Bilirubin, Direct: 0.1 mg/dL (ref 0.0–0.2)
Globulin: 2.8 g/dL (calc) (ref 1.9–3.7)
Indirect Bilirubin: 0.5 mg/dL (calc) (ref 0.2–1.2)
Total Bilirubin: 0.6 mg/dL (ref 0.2–1.2)
Total Protein: 7.4 g/dL (ref 6.1–8.1)

## 2020-07-19 LAB — LIPID PANEL
Cholesterol: 264 mg/dL — ABNORMAL HIGH (ref <200)
HDL: 58 mg/dL (ref >=40)
LDL Cholesterol (Calc): 182 mg/dL (calc) — ABNORMAL HIGH
Non-HDL Cholesterol (Calc): 206 mg/dL (calc) — ABNORMAL HIGH (ref <130)
Total CHOL/HDL Ratio: 4.6 (calc) (ref <5.0)
Triglycerides: 109 mg/dL (ref <150)

## 2020-07-19 LAB — CBC WITH DIFFERENTIAL/PLATELET
Absolute Monocytes: 437 cells/uL (ref 200–950)
Basophils Absolute: 47 cells/uL (ref 0–200)
Basophils Relative: 0.8 %
Eosinophils Absolute: 171 cells/uL (ref 15–500)
Eosinophils Relative: 2.9 %
HCT: 41.8 % (ref 38.5–50.0)
Hemoglobin: 14.1 g/dL (ref 13.2–17.1)
Lymphs Abs: 1805 cells/uL (ref 850–3900)
MCH: 31.3 pg (ref 27.0–33.0)
MCHC: 33.7 g/dL (ref 32.0–36.0)
MCV: 92.7 fL (ref 80.0–100.0)
MPV: 9.4 fL (ref 7.5–12.5)
Monocytes Relative: 7.4 %
Neutro Abs: 3440 cells/uL (ref 1500–7800)
Neutrophils Relative %: 58.3 %
Platelets: 348 10*3/uL (ref 140–400)
RBC: 4.51 10*6/uL (ref 4.20–5.80)
RDW: 11.8 % (ref 11.0–15.0)
Total Lymphocyte: 30.6 %
WBC: 5.9 10*3/uL (ref 3.8–10.8)

## 2020-07-19 LAB — BASIC METABOLIC PANEL WITH GFR
BUN/Creatinine Ratio: 11 (calc) (ref 6–22)
BUN: 16 mg/dL (ref 7–25)
CO2: 29 mmol/L (ref 20–32)
Calcium: 9.8 mg/dL (ref 8.6–10.3)
Chloride: 100 mmol/L (ref 98–110)
Creat: 1.42 mg/dL — ABNORMAL HIGH (ref 0.60–1.35)
GFR, Est African American: 68 mL/min/{1.73_m2} (ref >=60)
GFR, Est Non African American: 58 mL/min/{1.73_m2} — ABNORMAL LOW (ref >=60)
Glucose, Bld: 84 mg/dL (ref 65–99)
Potassium: 4.2 mmol/L (ref 3.5–5.3)
Sodium: 138 mmol/L (ref 135–146)

## 2020-07-19 LAB — TSH: TSH: 2.45 mIU/L (ref 0.40–4.50)

## 2020-07-19 LAB — PSA: PSA: 0.99 ng/mL (ref <=4.0)

## 2020-07-26 ENCOUNTER — Other Ambulatory Visit: Payer: Self-pay

## 2020-07-26 MED ORDER — ATORVASTATIN CALCIUM 10 MG PO TABS: 10.0000 mg | ORAL_TABLET | Freq: Every day | ORAL | 3 refills | Status: DC

## 2020-10-03 ENCOUNTER — Other Ambulatory Visit: Payer: Self-pay

## 2020-10-04 ENCOUNTER — Encounter: Payer: Self-pay | Admitting: Family Medicine

## 2020-10-04 ENCOUNTER — Ambulatory Visit: Payer: Commercial Managed Care - PPO | Admitting: Family Medicine

## 2020-10-04 VITALS — BP 108/78 | HR 51 | Temp 97.9°F | Wt 216.2 lb

## 2020-10-04 DIAGNOSIS — Q254 Congenital malformation of aorta unspecified: Secondary | ICD-10-CM | POA: Diagnosis not present

## 2020-10-04 DIAGNOSIS — H6123 Impacted cerumen, bilateral: Secondary | ICD-10-CM

## 2020-10-04 DIAGNOSIS — I77810 Thoracic aortic ectasia: Secondary | ICD-10-CM

## 2020-10-04 DIAGNOSIS — R0789 Other chest pain: Secondary | ICD-10-CM

## 2020-10-04 NOTE — Progress Notes (Signed)
   Subjective:    Patient ID: Brandon Campbell, male    DOB: March 14, 1971, 50 y.o.   MRN: [id number]  HPI Here for a well exam. He has a few issues to discuss. First he has had trouble hearing in both ears for a few weeks. No pain. No sinus congestion. Also he has occasional sharp very brief pains in the chest and back when he coughs or sneezes. These started a few years ago. They only occur several times a month. No SOB. He brings copies of some imaging scans he has had on the last few months. A chest CT dated 05-04-20 shows a 3.9 cm dilatation of the ascending aorta. He also has MRI scans of the cervical and lumbar spines showing degenerative discs and some uncovertebral spurring.    Review of Systems  Constitutional: Negative.   HENT: Positive for hearing loss. Negative for ear discharge, ear pain and sinus pressure.   Eyes: Negative.   Respiratory: Negative.   Cardiovascular: Positive for chest pain. Negative for palpitations and leg swelling.       Objective:   Physical Exam Constitutional:      General: He is not in acute distress.    Appearance: Normal appearance.  HENT:     Ears:     Comments: Both ear canals are full of cerumen  Cardiovascular:     Rate and Rhythm: Normal rate and regular rhythm.     Pulses: Normal pulses.     Heart sounds: Normal heart sounds.  Pulmonary:     Effort: Pulmonary effort is normal.     Breath sounds: Normal breath sounds.  Musculoskeletal:     Comments: The spine is not tender and has full ROM. No chest wall tenderness   Neurological:     Mental Status: He is alert.           Assessment & Plan:  For the cerumen impactions ,these were irrigated clear with water. His chest wall pain sounds like costochondritis. He can use Ibuprofen as needed. For the enlarged ascending aorta, we will refer him to Vascular Surgery. Gershon Crane, MD

## 2020-12-07 ENCOUNTER — Other Ambulatory Visit: Payer: Self-pay | Admitting: Diagnostic Neuroimaging

## 2020-12-07 NOTE — Telephone Encounter (Signed)
Last seen October 2021. Next appt scheduled for 06/05/21. Per [place] registry, last filled a 30 day supply on 11/06/2020. Will send Rx refills to Dr Marjory Lies to e-scribe.

## 2020-12-15 ENCOUNTER — Other Ambulatory Visit: Payer: Self-pay

## 2020-12-15 DIAGNOSIS — Q254 Congenital malformation of aorta unspecified: Secondary | ICD-10-CM

## 2020-12-18 ENCOUNTER — Telehealth: Payer: Self-pay | Admitting: Family Medicine

## 2020-12-18 DIAGNOSIS — I712 Thoracic aortic aneurysm, without rupture, unspecified: Secondary | ICD-10-CM

## 2020-12-18 NOTE — Telephone Encounter (Addendum)
Informed patient of provider message and patient is requesting a call back and has questions,  please advise. CB [phone]

## 2020-12-18 NOTE — Telephone Encounter (Signed)
Tell the patient that Vascular Surgery wanted [place] to do another scan of his chest before they will see him. I have set this up

## 2020-12-20 ENCOUNTER — Inpatient Hospital Stay: Admission: RE | Admit: 2020-12-20 | Payer: Commercial Managed Care - PPO | Source: Ambulatory Visit

## 2020-12-20 NOTE — Telephone Encounter (Signed)
All I did was relay the message I got from Vascular Surgery. Have the patient contact them directly and ask if he really needs another scan or not

## 2020-12-20 NOTE — Telephone Encounter (Signed)
Spoke with pt verbalized understanding of Dr Clent Ridges advise, office contact number provided to pt

## 2020-12-20 NOTE — Telephone Encounter (Signed)
Spoke with patient, he has a CT chest scan scheduled for today, but he is not going.   He wants to know can he see the Cardiologist without having the scan.  He said he has had scans in the past and wanted to know will those be okay to use, or should he reschedule the CT chest scan?  Please advise

## 2020-12-29 ENCOUNTER — Telehealth: Payer: Self-pay | Admitting: *Deleted

## 2020-12-29 NOTE — Telephone Encounter (Signed)
left message to see if pt wanted to r/s appt with our office, he would need to have his CTA scan r/s first, left message to return call

## 2021-01-15 ENCOUNTER — Telehealth: Payer: Self-pay | Admitting: Family Medicine

## 2021-01-15 DIAGNOSIS — Q254 Congenital malformation of aorta unspecified: Secondary | ICD-10-CM

## 2021-01-15 NOTE — Telephone Encounter (Signed)
Brandon Campbell is calling and is requesting a call back and had questions regarding patient medical condition for appointment, please advise. CB is [phone]

## 2021-01-16 NOTE — Telephone Encounter (Signed)
Spoke with patient, lab appointment scheduled for 01/19/21.

## 2021-01-16 NOTE — Telephone Encounter (Signed)
I put in the order for a BMET

## 2021-01-16 NOTE — Telephone Encounter (Signed)
Left voicemail on mobil number for patient to call back to schedule lab appointment.

## 2021-01-16 NOTE — Telephone Encounter (Signed)
Spoke with Benna Dunks in CT, this patient is scheduled for a CT on June 24th, since the patient takes Metoprolol Succinate the radiologist requires a BMET to be complete before the scan. Is it okay to place orders?

## 2021-01-17 ENCOUNTER — Other Ambulatory Visit: Payer: Self-pay | Admitting: *Deleted

## 2021-01-19 ENCOUNTER — Other Ambulatory Visit: Payer: Self-pay

## 2021-01-19 ENCOUNTER — Other Ambulatory Visit (INDEPENDENT_AMBULATORY_CARE_PROVIDER_SITE_OTHER): Payer: Commercial Managed Care - PPO

## 2021-01-19 DIAGNOSIS — Q254 Congenital malformation of aorta unspecified: Secondary | ICD-10-CM | POA: Diagnosis not present

## 2021-01-19 LAB — BASIC METABOLIC PANEL
BUN: 18 mg/dL (ref 6–23)
CO2: 26 mEq/L (ref 19–32)
Calcium: 9.8 mg/dL (ref 8.4–10.5)
Chloride: 103 mEq/L (ref 96–112)
Creatinine, Ser: 1.47 mg/dL (ref 0.40–1.50)
GFR: 56.06 mL/min — ABNORMAL LOW (ref >60.00)
Glucose, Bld: 121 mg/dL — ABNORMAL HIGH (ref 70–99)
Potassium: 4.1 mEq/L (ref 3.5–5.1)
Sodium: 138 mEq/L (ref 135–145)

## 2021-02-02 ENCOUNTER — Other Ambulatory Visit: Payer: Self-pay

## 2021-02-02 ENCOUNTER — Ambulatory Visit (INDEPENDENT_AMBULATORY_CARE_PROVIDER_SITE_OTHER)
Admission: RE | Admit: 2021-02-02 | Discharge: 2021-02-02 | Disposition: A | Discharge status: Home / Self Care | Payer: Commercial Managed Care - PPO | Source: Ambulatory Visit | Attending: Family Medicine | Admitting: Family Medicine

## 2021-02-02 DIAGNOSIS — I712 Thoracic aortic aneurysm, without rupture, unspecified: Secondary | ICD-10-CM

## 2021-02-02 HISTORY — DX: Essential (primary) hypertension: I10

## 2021-02-02 MED ORDER — IOHEXOL 350 MG/ML SOLN
100.0000 mL | Freq: Once | INTRAVENOUS | Status: AC | PRN
  Administered 2021-02-02: 100 mL via INTRAVENOUS

## 2021-02-05 NOTE — Addendum Note (Signed)
Addended by: Gershon Crane A on: 02/05/2021 07:51 AM   Modules accepted: Orders

## 2021-02-07 ENCOUNTER — Telehealth: Payer: Self-pay | Admitting: Family Medicine

## 2021-02-07 NOTE — Addendum Note (Signed)
Addended by: Gershon Crane A on: 02/07/2021 12:07 PM   Modules accepted: Orders

## 2021-02-07 NOTE — Telephone Encounter (Signed)
Pt call and stated she is returning your call and want a call back. 

## 2021-02-07 NOTE — Telephone Encounter (Signed)
Done

## 2021-02-08 NOTE — Telephone Encounter (Signed)
Spoke with pt verbalized understanding of his CT report, pt aware that Dr Clent Ridges placed referral to cardiology

## 2021-02-22 ENCOUNTER — Encounter: Payer: Commercial Managed Care - PPO | Admitting: Cardiothoracic Surgery

## 2021-03-08 ENCOUNTER — Institutional Professional Consult (permissible substitution) (INDEPENDENT_AMBULATORY_CARE_PROVIDER_SITE_OTHER): Payer: Commercial Managed Care - PPO | Admitting: Cardiothoracic Surgery

## 2021-03-08 ENCOUNTER — Other Ambulatory Visit: Payer: Self-pay

## 2021-03-08 VITALS — BP 119/78 | HR 56 | Resp 20 | Ht 72.0 in | Wt 210.0 lb

## 2021-03-08 DIAGNOSIS — I7121 Aneurysm of the ascending aorta, without rupture: Secondary | ICD-10-CM

## 2021-03-08 DIAGNOSIS — I712 Thoracic aortic aneurysm, without rupture: Secondary | ICD-10-CM | POA: Diagnosis not present
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
## 2021-04-19 ENCOUNTER — Ambulatory Visit (HOSPITAL_BASED_OUTPATIENT_CLINIC_OR_DEPARTMENT_OTHER): Payer: Commercial Managed Care - PPO | Admitting: Cardiology
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
## 2021-06-04 NOTE — Patient Instructions (Addendum)
Below is our plan:  We will continue Vimpat 150mg  twice daily. Continue conversation with dentistry regarding oral appliance.   Please make sure you are staying well hydrated. I recommend 50-60 ounces daily. Well balanced diet and regular exercise encouraged. Consistent sleep schedule with 6-8 hours recommended.   Please continue follow up with care team as directed.   Follow up with me in 1 year   You may receive a survey regarding today's visit. I encourage you to leave honest feed back as I do use this information to improve patient care. Thank you for seeing me today!

## 2021-06-05 ENCOUNTER — Encounter: Payer: Self-pay | Admitting: Family Medicine

## 2021-06-05 ENCOUNTER — Telehealth: Payer: Commercial Managed Care - PPO | Admitting: Family Medicine

## 2021-06-05 VITALS — BP 120/80 | HR 52 | Ht 72.0 in | Wt 210.0 lb

## 2021-06-05 DIAGNOSIS — R569 Unspecified convulsions: Secondary | ICD-10-CM

## 2021-06-05 DIAGNOSIS — G4733 Obstructive sleep apnea (adult) (pediatric): Secondary | ICD-10-CM | POA: Diagnosis not present

## 2021-06-05 MED ORDER — LACOSAMIDE 150 MG PO TABS: ORAL_TABLET | ORAL | 1 refills | Status: DC

## 2021-06-16 ENCOUNTER — Other Ambulatory Visit: Payer: Self-pay | Admitting: Diagnostic Neuroimaging

## 2021-06-18 MED ORDER — LACOSAMIDE 150 MG PO TABS: ORAL_TABLET | ORAL | 2 refills | Status: DC

## 2021-06-18 NOTE — Addendum Note (Signed)
Addended by: Ann Maki on: 06/18/2021 10:28 AM   Modules accepted: Orders

## 2021-06-18 NOTE — Addendum Note (Signed)
Addended byNicholas Lose, Kamel Haven L on: 06/18/2021 01:30 PM   Modules accepted: Orders

## 2021-07-20 ENCOUNTER — Encounter: Payer: Self-pay | Admitting: Family Medicine

## 2021-07-20 ENCOUNTER — Ambulatory Visit (INDEPENDENT_AMBULATORY_CARE_PROVIDER_SITE_OTHER): Payer: Commercial Managed Care - PPO | Admitting: Family Medicine

## 2021-07-20 VITALS — BP 118/78 | HR 48 | Temp 98.4°F | Ht 73.0 in | Wt 211.4 lb

## 2021-07-20 DIAGNOSIS — I10 Essential (primary) hypertension: Secondary | ICD-10-CM | POA: Diagnosis not present

## 2021-07-20 DIAGNOSIS — Z0000 Encounter for general adult medical examination without abnormal findings: Secondary | ICD-10-CM

## 2021-07-20 LAB — LIPID PANEL
Cholesterol: 225 mg/dL — ABNORMAL HIGH (ref 0–200)
HDL: 40.3 mg/dL (ref >39.00)
LDL Cholesterol: 164 mg/dL — ABNORMAL HIGH (ref 0–99)
NonHDL: 184.24
Total CHOL/HDL Ratio: 6
Triglycerides: 99 mg/dL (ref 0.0–149.0)
VLDL: 19.8 mg/dL (ref 0.0–40.0)

## 2021-07-20 LAB — CBC WITH DIFFERENTIAL/PLATELET
Basophils Absolute: 0 10*3/uL (ref 0.0–0.1)
Basophils Relative: 0.6 % (ref 0.0–3.0)
Eosinophils Absolute: 0.1 10*3/uL (ref 0.0–0.7)
Eosinophils Relative: 2.1 % (ref 0.0–5.0)
HCT: 40.2 % (ref 39.0–52.0)
Hemoglobin: 13.7 g/dL (ref 13.0–17.0)
Lymphocytes Relative: 32.9 % (ref 12.0–46.0)
Lymphs Abs: 2.1 10*3/uL (ref 0.7–4.0)
MCHC: 34.1 g/dL (ref 30.0–36.0)
MCV: 93.2 fl (ref 78.0–100.0)
Monocytes Absolute: 0.6 10*3/uL (ref 0.1–1.0)
Monocytes Relative: 8.6 % (ref 3.0–12.0)
Neutro Abs: 3.6 10*3/uL (ref 1.4–7.7)
Neutrophils Relative %: 55.8 % (ref 43.0–77.0)
Platelets: 324 10*3/uL (ref 150.0–400.0)
RBC: 4.32 Mil/uL (ref 4.22–5.81)
RDW: 13 % (ref 11.5–15.5)
WBC: 6.5 10*3/uL (ref 4.0–10.5)

## 2021-07-20 LAB — HEPATIC FUNCTION PANEL
ALT: 27 U/L (ref 0–53)
AST: 20 U/L (ref 0–37)
Albumin: 4.3 g/dL (ref 3.5–5.2)
Alkaline Phosphatase: 129 U/L — ABNORMAL HIGH (ref 39–117)
Bilirubin, Direct: 0.1 mg/dL (ref 0.0–0.3)
Total Bilirubin: 0.9 mg/dL (ref 0.2–1.2)
Total Protein: 6.9 g/dL (ref 6.0–8.3)

## 2021-07-20 LAB — BASIC METABOLIC PANEL
BUN: 20 mg/dL (ref 6–23)
CO2: 28 mEq/L (ref 19–32)
Calcium: 9.8 mg/dL (ref 8.4–10.5)
Chloride: 102 mEq/L (ref 96–112)
Creatinine, Ser: 1.5 mg/dL (ref 0.40–1.50)
GFR: 54.53 mL/min — ABNORMAL LOW (ref >60.00)
Glucose, Bld: 87 mg/dL (ref 70–99)
Potassium: 4.1 mEq/L (ref 3.5–5.1)
Sodium: 138 mEq/L (ref 135–145)

## 2021-07-20 LAB — HEMOGLOBIN A1C: Hgb A1c MFr Bld: 5.5 % (ref 4.6–6.5)

## 2021-07-20 LAB — PSA: PSA: 0.85 ng/mL (ref 0.10–4.00)

## 2021-07-20 LAB — TSH: TSH: 1.91 u[IU]/mL (ref 0.35–5.50)

## 2021-07-20 MED ORDER — METOPROLOL SUCCINATE ER 50 MG PO TB24: 50.0000 mg | ORAL_TABLET | Freq: Every day | ORAL | 3 refills | Status: DC

## 2021-07-20 NOTE — Progress Notes (Signed)
Subjective:    Patient ID: Brandon Campbell, male    DOB: 03/22/1971, 50 y.o.   MRN: [id number]  HPI Here for a well exam. He feels well in general but he asks [place] to check his left ear. Sounds are muffles so he thinks he has a wax build up again. He saw Cardiovascular Surgery on 03-08-21 about the 4.3 cm ascending aortic aneurysm. They decided to simply oberve this for now he will get a follow up chest CT in 2 years. His urticaria is well controlled with Xolair.    Review of Systems  Constitutional: Negative.   HENT: Negative.    Eyes: Negative.   Respiratory: Negative.    Cardiovascular: Negative.   Gastrointestinal: Negative.   Genitourinary: Negative.   Musculoskeletal: Negative.   Skin: Negative.   Neurological: Negative.   Psychiatric/Behavioral: Negative.        Objective:   Physical Exam Constitutional:      General: He is not in acute distress.    Appearance: Normal appearance. He is well-developed. He is not diaphoretic.  HENT:     Head: Normocephalic and atraumatic.     Right Ear: Tympanic membrane, ear canal and external ear normal.     Left Ear: There is impacted cerumen.     Nose: Nose normal.     Mouth/Throat:     Pharynx: No oropharyngeal exudate.  Eyes:     General: No scleral icterus.       Right eye: No discharge.        Left eye: No discharge.     Conjunctiva/sclera: Conjunctivae normal.     Pupils: Pupils are equal, round, and reactive to light.  Neck:     Thyroid: No thyromegaly.     Vascular: No JVD.     Trachea: No tracheal deviation.  Cardiovascular:     Rate and Rhythm: Normal rate and regular rhythm.     Heart sounds: Normal heart sounds. No murmur heard.   No friction rub. No gallop.  Pulmonary:     Effort: Pulmonary effort is normal. No respiratory distress.     Breath sounds: Normal breath sounds. No wheezing or rales.  Chest:     Chest wall: No tenderness.  Abdominal:     General: Bowel sounds are normal. There is no  distension.     Palpations: Abdomen is soft. There is no mass.     Tenderness: There is no abdominal tenderness. There is no guarding or rebound.  Genitourinary:    Penis: Normal. No tenderness.      Testes: Normal.     Prostate: Normal.     Rectum: Normal. Guaiac result negative.  Musculoskeletal:        General: No tenderness. Normal range of motion.     Cervical back: Neck supple.  Lymphadenopathy:     Cervical: No cervical adenopathy.  Skin:    General: Skin is warm and dry.     Coloration: Skin is not pale.     Findings: No erythema or rash.  Neurological:     Mental Status: He is alert and oriented to person, place, and time.     Cranial Nerves: No cranial nerve deficit.     Motor: No abnormal muscle tone.     Coordination: Coordination normal.     Deep Tendon Reflexes: Reflexes are normal and symmetric. Reflexes normal.  Psychiatric:        Behavior: Behavior normal.        Thought Content:  Thought content normal.        Judgment: Judgment normal.          Assessment & Plan:  Well exam. We discussed diet and exercise. Get fasting labs. After informed consent was obtained, the left ear canal was irrigated clear with water. He tolerated thie procedure well, and afterwards his hearing was back to normal.  Gershon Crane, MD

## 2021-07-23 ENCOUNTER — Telehealth: Payer: Self-pay | Admitting: Family Medicine

## 2021-07-23 NOTE — Telephone Encounter (Signed)
Patient informed-see results note. 

## 2021-07-23 NOTE — Telephone Encounter (Signed)
Pt is calling back and would like blood work results 

## 2021-08-10 ENCOUNTER — Telehealth: Payer: Self-pay | Admitting: Family Medicine

## 2021-08-10 NOTE — Telephone Encounter (Signed)
Pt went to dermatologist and his bp was 142/92 and they told him take his bp med metoprolol succinate (TOPROL-XL) 50 MG 24 hr tablet twice a day instead of once a day. Pt has an appt with dr fry on 08-14-2021. I called the patient and he has enough medication to last until he see's dr fry. Triage nurse called

## 2021-08-14 ENCOUNTER — Ambulatory Visit: Payer: Commercial Managed Care - PPO | Admitting: Family Medicine

## 2021-08-14 ENCOUNTER — Encounter: Payer: Self-pay | Admitting: Family Medicine

## 2021-08-14 VITALS — BP 124/92 | HR 48 | Temp 97.9°F | Wt 214.0 lb

## 2021-08-14 DIAGNOSIS — I10 Essential (primary) hypertension: Secondary | ICD-10-CM | POA: Diagnosis not present

## 2021-08-14 NOTE — Progress Notes (Signed)
° °  Subjective:    Patient ID: Brandon Campbell, male    DOB: June 23, 1971, 51 y.o.   MRN: [id number]  HPI Here to follow up on BP. He takes Metoprolol succinate 50 mg once a day. He feels fine. He has been averaging in the 130's over 80's at home. At the allergist's office last week the BP was 142/92, and they advised him to see [place].    Review of Systems  Constitutional: Negative.   Respiratory: Negative.    Cardiovascular: Negative.       Objective:   Physical Exam Constitutional:      Appearance: Normal appearance.  Cardiovascular:     Rate and Rhythm: Normal rate and regular rhythm.     Pulses: Normal pulses.     Heart sounds: Normal heart sounds.  Pulmonary:     Effort: Pulmonary effort is normal.     Breath sounds: Normal breath sounds.  Neurological:     Mental Status: He is alert.          Assessment & Plan:  His HTN is well managed. We will keep the medications as they are.  Alysia Penna, MD

## 2021-09-19 ENCOUNTER — Other Ambulatory Visit: Payer: Self-pay | Admitting: Family Medicine

## 2021-09-19 MED ORDER — LACOSAMIDE 150 MG PO TABS: ORAL_TABLET | ORAL | 7 refills | Status: DC

## 2021-09-19 NOTE — Telephone Encounter (Signed)
Pt is needing a refill request for his Lacosamide 150 MG TABS sent to the [facility] on [address].

## 2021-09-25 ENCOUNTER — Other Ambulatory Visit: Payer: Self-pay | Admitting: Family Medicine

## 2021-09-25 NOTE — Telephone Encounter (Signed)
Pt is requesting a refill for Lacosamide 150 MG TABS.  Pharmacy: [facility] across from [facility]

## 2021-09-26 MED ORDER — LACOSAMIDE 150 MG PO TABS: ORAL_TABLET | ORAL | 7 refills | Status: DC

## 2022-03-29 ENCOUNTER — Other Ambulatory Visit: Payer: Self-pay | Admitting: Neurology

## 2022-04-02 NOTE — Telephone Encounter (Signed)
Last visit: 06/05/21 Next visit: 06/05/22 Per [place] registry, last filled #60/30 on 02/26/2022. Rx refill sent to Amy NP.
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
## 2022-06-05 ENCOUNTER — Ambulatory Visit (INDEPENDENT_AMBULATORY_CARE_PROVIDER_SITE_OTHER): Payer: BC Managed Care – PPO | Admitting: Family Medicine

## 2022-06-05 ENCOUNTER — Encounter: Payer: Self-pay | Admitting: Family Medicine

## 2022-06-05 VITALS — BP 144/99 | HR 59 | Ht 72.0 in | Wt 214.6 lb

## 2022-06-05 DIAGNOSIS — R569 Unspecified convulsions: Secondary | ICD-10-CM | POA: Diagnosis not present

## 2022-06-05 MED ORDER — LACOSAMIDE 150 MG PO TABS: ORAL_TABLET | ORAL | 1 refills | Status: DC

## 2022-06-05 NOTE — Patient Instructions (Signed)
Below is our plan:  We will continue lacosamide 150mg twice daily.   Please make sure you are consistent with timing of seizure medication. I recommend annual visit with primary care provider (PCP) for complete physical and routine blood work. I recommend daily intake of vitamin D (400-800iu) and calcium (800-1000mg) for bone health. Discuss Dexa screening with PCP.   According to [place] law, you can not drive unless you are seizure / syncope free for at least 6 months and under physician's care.  Please maintain precautions. Do not participate in activities where a loss of awareness could harm you or someone else. No swimming alone, no tub bathing, no hot tubs, no driving, no operating motorized vehicles (cars, ATVs, motocycles, etc), lawnmowers, power tools or firearms. No standing at heights, such as rooftops, ladders or stairs. Avoid hot objects such as stoves, heaters, open fires. Wear a helmet when riding a bicycle, scooter, skateboard, etc. and avoid areas of traffic. Set your water heater to 120 degrees or less.   Please make sure you are staying well hydrated. I recommend 50-60 ounces daily. Well balanced diet and regular exercise encouraged. Consistent sleep schedule with 6-8 hours recommended.   Please continue follow up with care team as directed.   Follow up with me in 1 year   You may receive a survey regarding today's visit. I encourage you to leave honest feed back as I do use this information to improve patient care. Thank you for seeing me today!    

## 2022-07-22 ENCOUNTER — Encounter: Payer: Self-pay | Admitting: Family Medicine

## 2022-07-22 ENCOUNTER — Ambulatory Visit (INDEPENDENT_AMBULATORY_CARE_PROVIDER_SITE_OTHER): Payer: BC Managed Care – PPO | Admitting: Family Medicine

## 2022-07-22 VITALS — BP 138/90 | HR 47 | Temp 97.6°F | Ht 72.0 in | Wt 216.0 lb

## 2022-07-22 DIAGNOSIS — Z0000 Encounter for general adult medical examination without abnormal findings: Secondary | ICD-10-CM | POA: Diagnosis not present

## 2022-07-22 LAB — BASIC METABOLIC PANEL
BUN: 18 mg/dL (ref 6–23)
CO2: 30 mEq/L (ref 19–32)
Calcium: 10 mg/dL (ref 8.4–10.5)
Chloride: 101 mEq/L (ref 96–112)
Creatinine, Ser: 1.36 mg/dL (ref 0.40–1.50)
GFR: 60.9 mL/min (ref >60.00)
Glucose, Bld: 88 mg/dL (ref 70–99)
Potassium: 3.9 mEq/L (ref 3.5–5.1)
Sodium: 138 mEq/L (ref 135–145)

## 2022-07-22 LAB — URINALYSIS
Bilirubin Urine: NEGATIVE
Hgb urine dipstick: NEGATIVE
Ketones, ur: NEGATIVE
Leukocytes,Ua: NEGATIVE
Nitrite: NEGATIVE
Specific Gravity, Urine: <=1.005 — AB (ref 1.000–1.030)
Total Protein, Urine: NEGATIVE
Urine Glucose: NEGATIVE
Urobilinogen, UA: 0.2 (ref 0.0–1.0)
pH: 6 (ref 5.0–8.0)

## 2022-07-22 LAB — CBC WITH DIFFERENTIAL/PLATELET
Basophils Absolute: 0 10*3/uL (ref 0.0–0.1)
Basophils Relative: 0.6 % (ref 0.0–3.0)
Eosinophils Absolute: 0.2 10*3/uL (ref 0.0–0.7)
Eosinophils Relative: 3.7 % (ref 0.0–5.0)
HCT: 40.1 % (ref 39.0–52.0)
Hemoglobin: 13.8 g/dL (ref 13.0–17.0)
Lymphocytes Relative: 38.9 % (ref 12.0–46.0)
Lymphs Abs: 2.4 10*3/uL (ref 0.7–4.0)
MCHC: 34.3 g/dL (ref 30.0–36.0)
MCV: 92.7 fl (ref 78.0–100.0)
Monocytes Absolute: 0.5 10*3/uL (ref 0.1–1.0)
Monocytes Relative: 8.7 % (ref 3.0–12.0)
Neutro Abs: 2.9 10*3/uL (ref 1.4–7.7)
Neutrophils Relative %: 48.1 % (ref 43.0–77.0)
Platelets: 361 10*3/uL (ref 150.0–400.0)
RBC: 4.33 Mil/uL (ref 4.22–5.81)
RDW: 13.4 % (ref 11.5–15.5)
WBC: 6.1 10*3/uL (ref 4.0–10.5)

## 2022-07-22 LAB — LIPID PANEL
Cholesterol: 225 mg/dL — ABNORMAL HIGH (ref 0–200)
HDL: 44 mg/dL (ref >39.00)
LDL Cholesterol: 153 mg/dL — ABNORMAL HIGH (ref 0–99)
NonHDL: 180.95
Total CHOL/HDL Ratio: 5
Triglycerides: 141 mg/dL (ref 0.0–149.0)
VLDL: 28.2 mg/dL (ref 0.0–40.0)

## 2022-07-22 LAB — HEPATIC FUNCTION PANEL
ALT: 20 U/L (ref 0–53)
AST: 17 U/L (ref 0–37)
Albumin: 4.7 g/dL (ref 3.5–5.2)
Alkaline Phosphatase: 117 U/L (ref 39–117)
Bilirubin, Direct: 0.1 mg/dL (ref 0.0–0.3)
Total Bilirubin: 0.8 mg/dL (ref 0.2–1.2)
Total Protein: 7.4 g/dL (ref 6.0–8.3)

## 2022-07-22 LAB — HEMOGLOBIN A1C: Hgb A1c MFr Bld: 5.6 % (ref 4.6–6.5)

## 2022-07-22 LAB — PSA: PSA: 0.89 ng/mL (ref 0.10–4.00)

## 2022-07-22 LAB — TSH: TSH: 2.8 u[IU]/mL (ref 0.35–5.50)

## 2022-07-22 MED ORDER — LOSARTAN POTASSIUM-HCTZ 50-12.5 MG PO TABS: 1.0000 | ORAL_TABLET | Freq: Every day | ORAL | 2 refills | Status: DC

## 2022-07-22 NOTE — Progress Notes (Signed)
Subjective:    Patient ID: Brandon Campbell, male    DOB: 07/23/1971, 51 y.o.   MRN: [id number]  HPI Here for a well exam. He feels great. We noticed that over the past year his BP has gone up a bit and his resting pulse rate is low.    Review of Systems  Constitutional: Negative.   HENT: Negative.    Eyes: Negative.   Respiratory: Negative.    Cardiovascular: Negative.   Gastrointestinal: Negative.   Genitourinary: Negative.   Musculoskeletal: Negative.   Skin: Negative.   Neurological: Negative.   Psychiatric/Behavioral: Negative.         Objective:   Physical Exam Constitutional:      General: He is not in acute distress.    Appearance: Normal appearance. He is well-developed. He is not diaphoretic.  HENT:     Head: Normocephalic and atraumatic.     Right Ear: External ear normal.     Left Ear: External ear normal.     Nose: Nose normal.     Mouth/Throat:     Pharynx: No oropharyngeal exudate.  Eyes:     General: No scleral icterus.       Right eye: No discharge.        Left eye: No discharge.     Conjunctiva/sclera: Conjunctivae normal.     Pupils: Pupils are equal, round, and reactive to light.  Neck:     Thyroid: No thyromegaly.     Vascular: No JVD.     Trachea: No tracheal deviation.  Cardiovascular:     Rate and Rhythm: Regular rhythm. Bradycardia present.     Pulses: Normal pulses.     Heart sounds: Normal heart sounds. No murmur heard.    No friction rub. No gallop.  Pulmonary:     Effort: Pulmonary effort is normal. No respiratory distress.     Breath sounds: Normal breath sounds. No wheezing or rales.  Chest:     Chest wall: No tenderness.  Abdominal:     General: Bowel sounds are normal. There is no distension.     Palpations: Abdomen is soft. There is no mass.     Tenderness: There is no abdominal tenderness. There is no guarding or rebound.  Genitourinary:    Penis: Normal. No tenderness.      Testes: Normal.     Prostate: Normal.      Rectum: Normal. Guaiac result negative.  Musculoskeletal:        General: No tenderness. Normal range of motion.     Cervical back: Neck supple.  Lymphadenopathy:     Cervical: No cervical adenopathy.  Skin:    General: Skin is warm and dry.     Coloration: Skin is not pale.     Findings: No erythema or rash.  Neurological:     General: No focal deficit present.     Mental Status: He is alert and oriented to person, place, and time.     Cranial Nerves: No cranial nerve deficit.     Motor: No abnormal muscle tone.     Coordination: Coordination normal.     Deep Tendon Reflexes: Reflexes are normal and symmetric. Reflexes normal.  Psychiatric:        Mood and Affect: Mood normal.        Behavior: Behavior normal.        Thought Content: Thought content normal.        Judgment: Judgment normal.  Assessment & Plan:  Well exam. We discussed diet and exercise. Get fasting labs. For ths HTN, we will stop Metoprolol and try Losartan HCTZ 50-12.5 daily. He will report back in 3-4 weeks. Set up his first colonoscopy.  Gershon Crane, MD

## 2022-11-04 ENCOUNTER — Other Ambulatory Visit: Payer: Self-pay | Admitting: Family Medicine

## 2022-12-05 ENCOUNTER — Other Ambulatory Visit: Payer: Self-pay | Admitting: Family Medicine

## 2022-12-09 ENCOUNTER — Other Ambulatory Visit: Payer: Self-pay

## 2022-12-09 MED ORDER — LACOSAMIDE 150 MG PO TABS
ORAL_TABLET | ORAL | 5 refills | Status: DC
Start: 2022-12-09 — End: 2023-06-16

## 2022-12-09 MED ORDER — LACOSAMIDE 150 MG PO TABS: ORAL_TABLET | ORAL | 5 refills | Status: DC

## 2023-01-11 ENCOUNTER — Other Ambulatory Visit: Payer: Self-pay | Admitting: Family Medicine

## 2023-01-17 ENCOUNTER — Other Ambulatory Visit: Payer: Self-pay | Admitting: Cardiothoracic Surgery

## 2023-01-17 DIAGNOSIS — I7121 Aneurysm of the ascending aorta, without rupture: Secondary | ICD-10-CM

## 2023-01-30 NOTE — Progress Notes (Deleted)
      [address]       [place] [address]             [phone]        Brandon Campbell [id number] 11/04/1970  History of Present Illness:  Brandon Campbell is a 52  yo male with history of HTN, Seizure, and known Ascending Aortic Aneurysm.  He was last evaluated in our office in 02/2021 at which time his aneurysm measured 4.2 cm.  He presents today for 2 year follow up with repeat CTA.     Current Outpatient Medications on File Prior to Visit  Medication Sig Dispense Refill   Ascorbic Acid (VITAMIN C) 1000 MG tablet Take 1,800 mg by mouth daily.     B Complex Vitamins (VITAMIN B COMPLEX PO) Take by mouth.     EPINEPHrine 0.3 mg/0.3 mL IJ SOAJ injection      ibuprofen (ADVIL,MOTRIN) 200 MG tablet Take 200 mg by mouth every 6 (six) hours as needed for moderate pain.     Lacosamide 150 MG TABS TAKE 1 TABLET BY MOUTH IN THE MORNING 1 IN THE EVENING 180 tablet 5   losartan-hydrochlorothiazide (HYZAAR) 50-12.5 MG tablet TAKE 1 TABLET BY MOUTH ONCE DAILY*** NEED TO CONTACT PCP REGARDING EFFECTIVENESS OF RX*** 30 tablet 0   omalizumab (XOLAIR) 150 MG injection Inject 150 mg into the skin every 28 (twenty-eight) days.     Vitamin D-Vitamin K (VITAMIN K2-VITAMIN D3 PO) Take by mouth.     No current facility-administered medications on file prior to visit.     There were no vitals taken for this visit.  Physical Exam  CTA Results:      A/P:  Ascending Aortic Aneurysm- measuring HTN     Risk Modification:  Statin:  No, will defer to PCP  Smoking cessation instruction/counseling given:  {CHL AMB [facility] SMOKING CESSATION COUNSELING:20758}  Patient was counseled on importance of Blood Pressure Control.  Despite Medical intervention if the patient notices persistently elevated blood pressure readings.  They are instructed to contact their Primary Care Physician  Please avoid use of Fluoroquinolones as this can potentially increase your risk of  Aortic Rupture and/or Dissection  Patient educated on signs and symptoms of Aortic Dissection, handout also provided in AVS  Riyaan Heroux, PA-C 01/30/23

## 2023-02-10 ENCOUNTER — Other Ambulatory Visit: Payer: BC Managed Care – PPO

## 2023-02-10 ENCOUNTER — Ambulatory Visit: Payer: BC Managed Care – PPO

## 2023-02-21 IMAGING — CT CT ANGIO CHEST
3 of 8 series · 18 of 46 positions shown · IV contrast (omnipaque)
Comparison: Chest radiograph 12/17/2017

CLINICAL DATA: 49-year-old with chest wall pain. Evaluate for
thoracic aortic aneurysm.

EXAM:
CT ANGIOGRAPHY CHEST WITH CONTRAST
TECHNIQUE: Multidetector CT imaging of the chest was performed using the
standard protocol during bolus administration of intravenous
contrast. Multiplanar CT image reconstructions and MIPs were
obtained to evaluate the vascular anatomy.
CONTRAST:  100mL OMNIPAQUE IOHEXOL 350 MG/ML SOLN

[Series 4: aorta 3.0 bf37 2 · axial · 0.62mm/px · z∈[+1334,+1626]mm · 13 of 115 slices shown]
[im 9/115  lung]
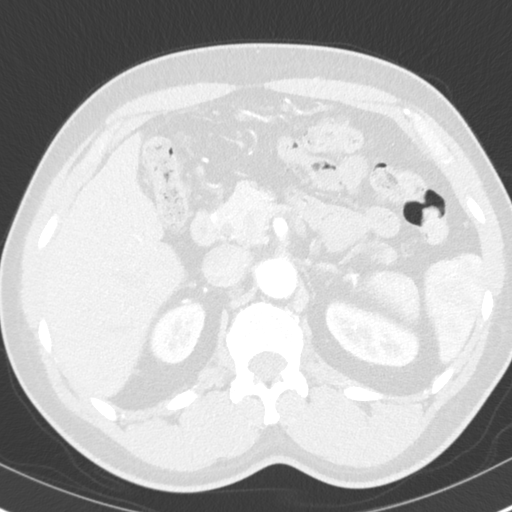
[im 17/115  soft-tissue]
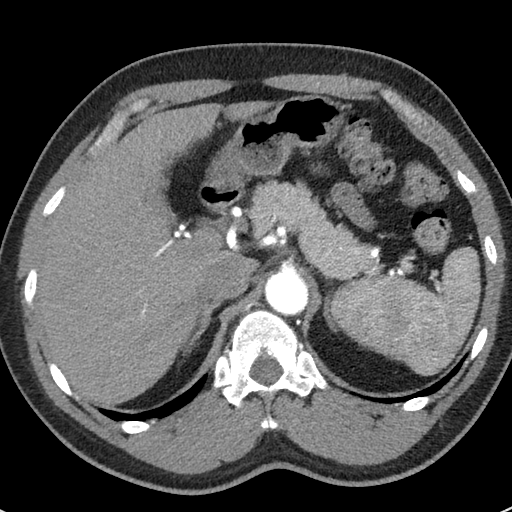
[im 25/115  lung]
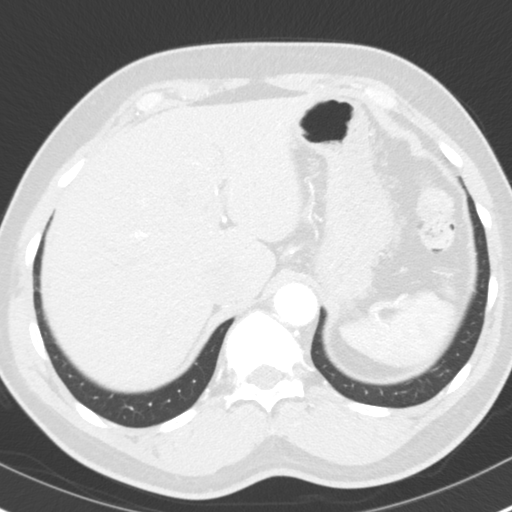
[im 33/115  soft-tissue]
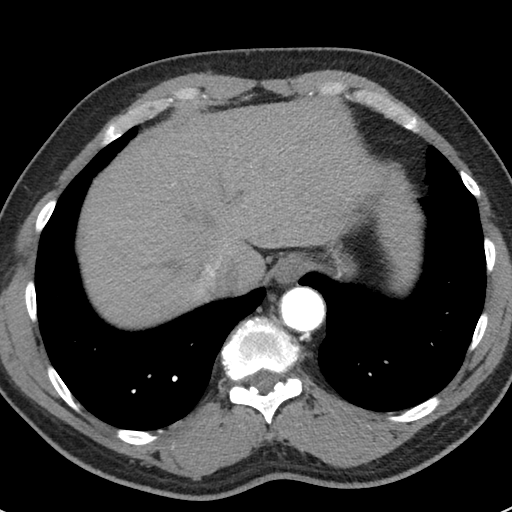
[im 41/115  lung]
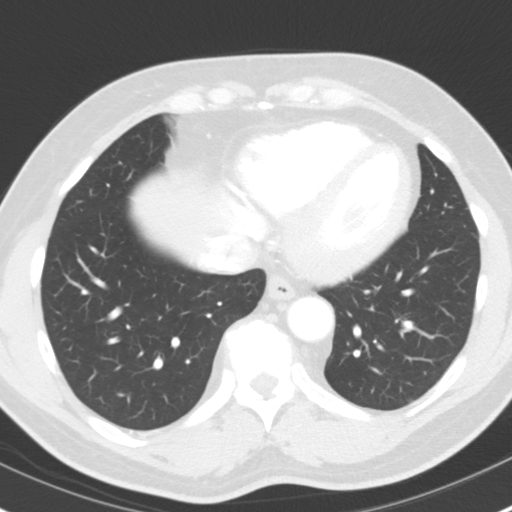
[im 49/115  soft-tissue]
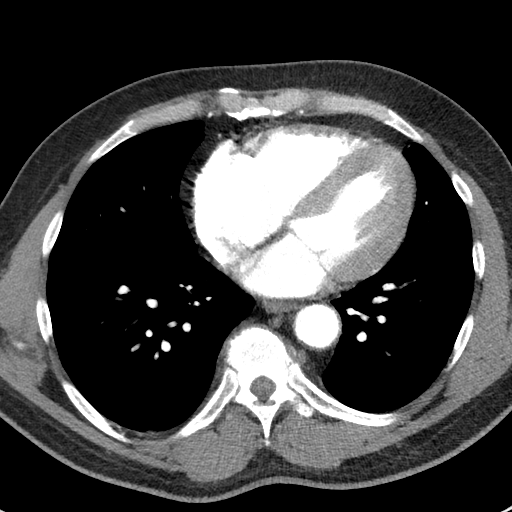
[im 58/115  lung]
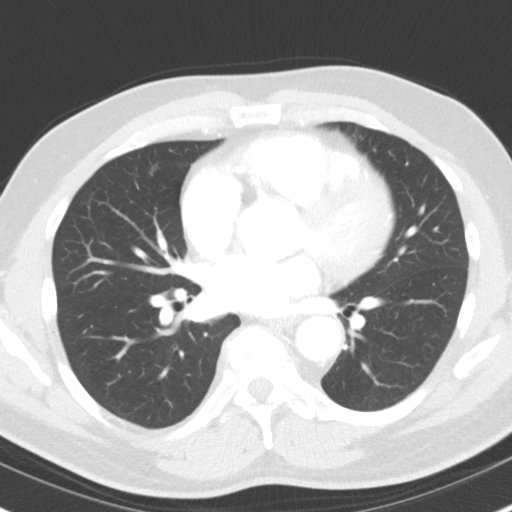
[im 66/115  soft-tissue]
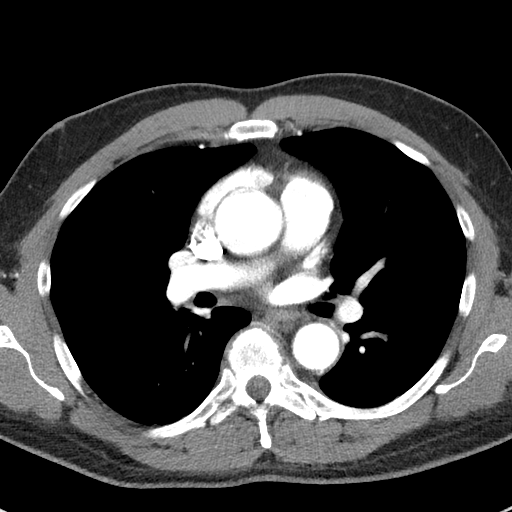
[im 74/115  lung]
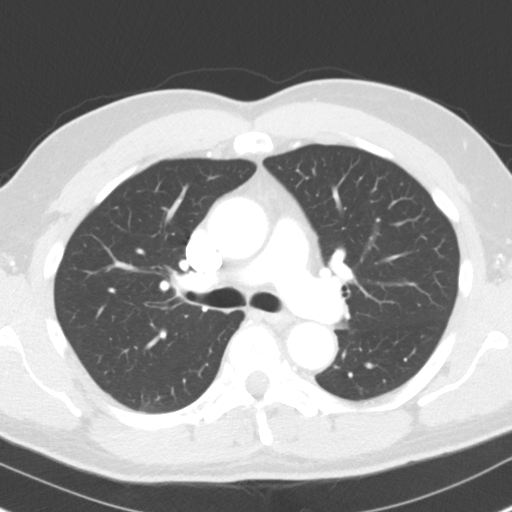
[im 82/115  soft-tissue]
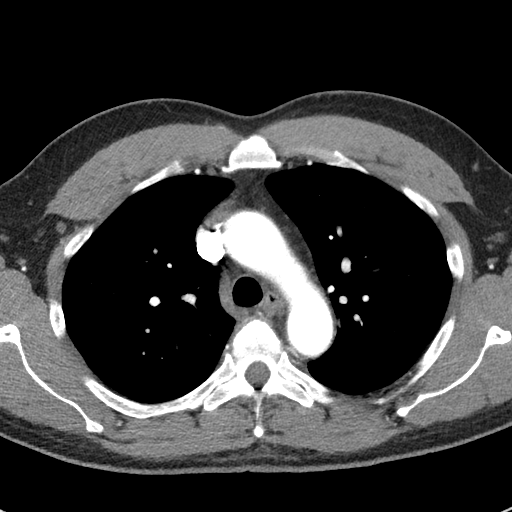
[im 90/115  lung]
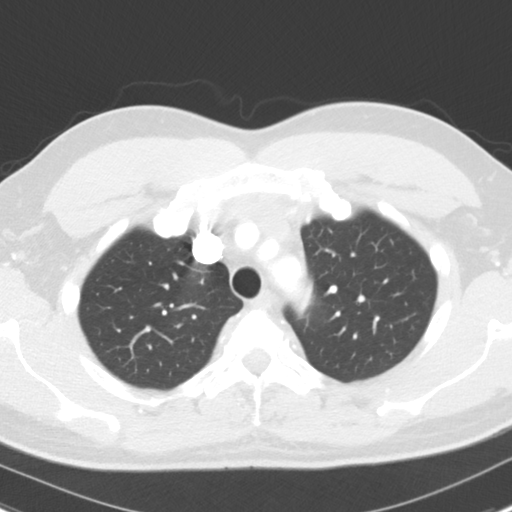
[im 98/115  soft-tissue]
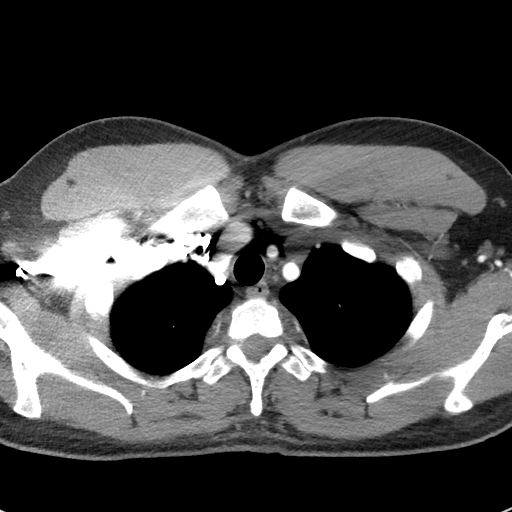
[im 106/115  lung]
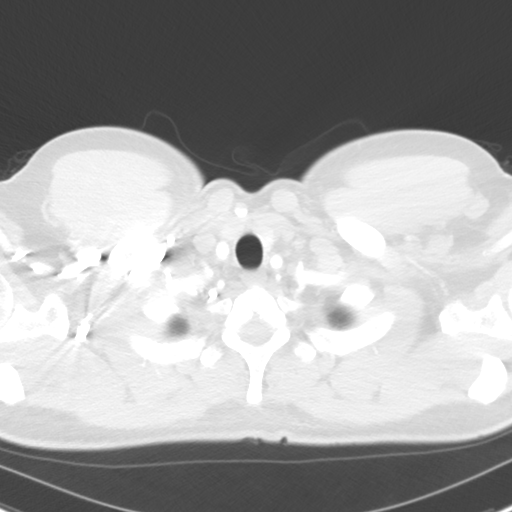

[Series 5: lung · axial · 0.62mm/px · z∈[+1334,+1382]mm · 2 of 115 slices shown]
[im 9/115  soft-tissue]
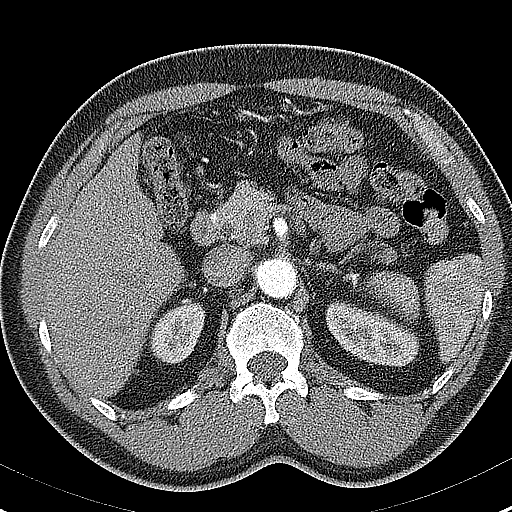
[im 25/115  soft-tissue]
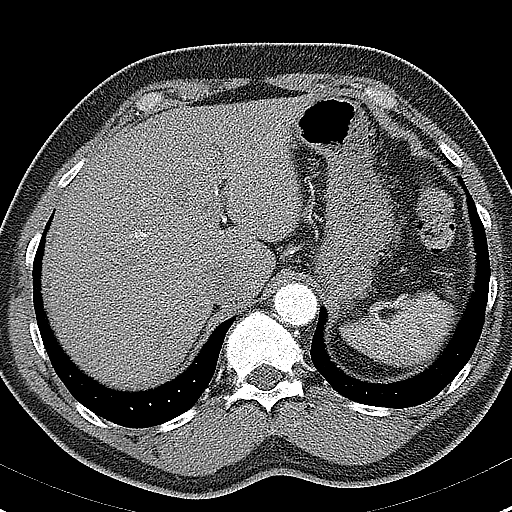

[Series 7: coronals · coronal · 0.69mm/px · 3 of 136 slices shown]
[im 34/136  soft-tissue]
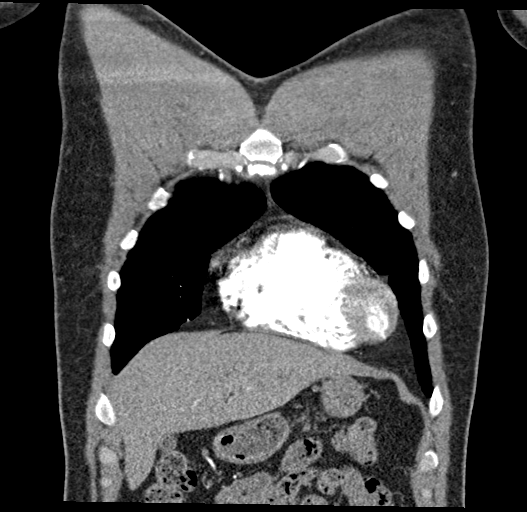
[im 68/136  soft-tissue]
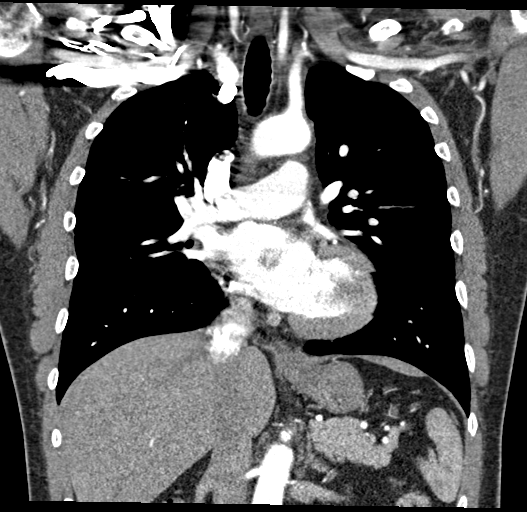
[im 102/136  soft-tissue]
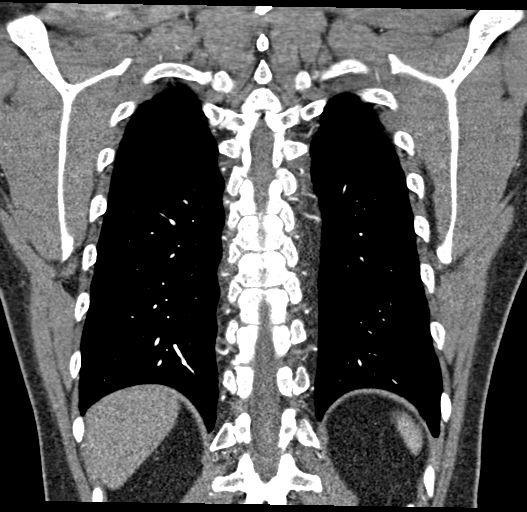

[18 of 46 positions shown; findings below may reference images not displayed]

FINDINGS: Cardiovascular: Aortic root at the sinuses of Valsalva measure up to
4.2 cm. Sinotubular junction measures 3.4 cm. Measurement of the
ascending thoracic aorta is slightly limited due to motion artifact.
Maximum dimension of the ascending thoracic aorta without motion
measures up to 3.8 cm. No evidence for an aortic dissection. Typical
three-vessel arch anatomy. Proximal aortic arch measures 3.2 cm.
Distal aortic arch measures 2.6 cm. Proximal descending thoracic
aorta measures 2.7 cm. Bilateral renal arteries are patent. Small
accessory left renal artery. Celiac trunk and SMA are patent.
Variant visceral anatomy with the splenic artery coming off the
proximal SMA. Heart size is normal. Main pulmonary arteries are
patent.

Mediastinum/Nodes: Small left supraclavicular lymph nodes. No
significant mediastinal or hilar lymph node enlargement. Small
axillary lymph nodes.

Lungs/Pleura: Trachea and mainstem bronchi are patent. No pleural
effusions. Few dependent densities in the lower lungs. No focal
airspace disease or lung consolidation.

Upper Abdomen: No acute abnormalities in the upper abdomen.

Musculoskeletal: No acute bone abnormality.

Review of the MIP images confirms the above findings.
IMPRESSION: 1. Aortic root is prominent for size measuring up to 4.2 cm.
2. Evaluation of the ascending thoracic aorta has limitations from
motion artifact but maximum dimension is approximately 3.8 cm.
3. Negative for atherosclerotic disease or dissection in the
thoracic aorta.
4. No acute chest abnormality.

## 2023-03-13 ENCOUNTER — Ambulatory Visit: Payer: BC Managed Care – PPO

## 2023-03-13 ENCOUNTER — Other Ambulatory Visit: Payer: BC Managed Care – PPO

## 2023-04-17 NOTE — Progress Notes (Deleted)
      [address]       [place] [address]             [phone]        Brandon Campbell [id number] [birth year]  History of Present Illness: Brandon Campbell is a 52 year old male with a past medical history of hypertension, thoracic ascending aortic aneurysm, sleep apnea, and seizures. He was incidentally found to have a thoracic aneurysm in 2022 after undergoing a CT scan for episodic back pain. At that time the aneurysm measured 4.3cm. He has no family history of TAA and no personal history of connective tissue disorder. He was last seen by Dr. Vickey Campbell in 2022 and was instructed to undergo surveillance every 2 years.   Today he reports *** chest pain, chest tightness, shortness of breath, dizziness and LOC.      Current Outpatient Medications on File Prior to Visit  Medication Sig Dispense Refill   Ascorbic Acid (VITAMIN C) 1000 MG tablet Take 1,800 mg by mouth daily.     B Complex Vitamins (VITAMIN B COMPLEX PO) Take by mouth.     EPINEPHrine 0.3 mg/0.3 mL IJ SOAJ injection      ibuprofen (ADVIL,MOTRIN) 200 MG tablet Take 200 mg by mouth every 6 (six) hours as needed for moderate pain.     Lacosamide 150 MG TABS TAKE 1 TABLET BY MOUTH IN THE MORNING 1 IN THE EVENING 180 tablet 5   losartan-hydrochlorothiazide (HYZAAR) 50-12.5 MG tablet TAKE 1 TABLET BY MOUTH ONCE DAILY*** NEED TO CONTACT PCP REGARDING EFFECTIVENESS OF RX*** 30 tablet 0   omalizumab (XOLAIR) 150 MG injection Inject 150 mg into the skin every 28 (twenty-eight) days.     Vitamin D-Vitamin K (VITAMIN K2-VITAMIN D3 PO) Take by mouth.     No current facility-administered medications on file prior to visit.   Vitals:  Physical Exam  CTA Results:      A/P: Ascending thoracic aortic aneurysm: [name] presents to the clinic with a *** cm ascending aortic aneurysm.  Echocardiogram shows a *** valve without evidence of regurgitation.  We discussed the natural history and and risk factors for  growth of ascending aortic aneurysms.  We covered the importance of smoking cessation, tight blood pressure control, refraining from lifting heavy objects, and avoiding fluoroquinolones.  The patient is aware of signs and symptoms of aortic dissection and when to present to the emergency department.  We will continue surveillance and a repeat ** was ordered for ***.      Risk Modification:  Statin:  ***  Smoking cessation instruction/counseling given:  {CHL AMB [facility] SMOKING CESSATION COUNSELING:20758}  Patient was counseled on importance of Blood Pressure Control.  Despite Medical intervention if the patient notices persistently elevated blood pressure readings.  They are instructed to contact their Primary Care Physician  Please avoid use of Fluoroquinolones as this can potentially increase your risk of Aortic Rupture and/or Dissection  Patient educated on signs and symptoms of Aortic Dissection, handout also provided in AVS  Jenny Reichmann, PA-C 04/17/23

## 2023-04-18 ENCOUNTER — Other Ambulatory Visit: Payer: BC Managed Care – PPO

## 2023-04-21 ENCOUNTER — Ambulatory Visit: Payer: BC Managed Care – PPO

## 2023-04-22 ENCOUNTER — Ambulatory Visit: Payer: BC Managed Care – PPO

## 2023-06-04 ENCOUNTER — Ambulatory Visit: Payer: Self-pay | Admitting: Family Medicine

## 2023-06-16 ENCOUNTER — Other Ambulatory Visit: Payer: Self-pay | Admitting: Family Medicine

## 2023-06-16 ENCOUNTER — Telehealth: Payer: Self-pay

## 2023-06-16 MED ORDER — LACOSAMIDE 150 MG PO TABS: ORAL_TABLET | ORAL | 0 refills | Status: DC

## 2023-06-16 NOTE — Addendum Note (Signed)
Addended by: Aura Camps on: 06/16/2023 08:50 AM   Modules accepted: Orders

## 2023-06-16 NOTE — Telephone Encounter (Addendum)
Last seen on 06/05/22 Follow up scheduled on 09/09/23 Last filled on 01/11/23 #60 tablets (30 day supply) Rx pending to be signed

## 2023-06-16 NOTE — Telephone Encounter (Signed)
Patient called the sleep lab and had questions about his seizure medication, said he still has not received a text notification about the order yet. I let the patient know that he had dialed into the sleep lab and I am unable to assist with medication questions, but I would send in a note to Amy Lomax and her team for someone to look into and return his call. CB # [phone]

## 2023-06-16 NOTE — Addendum Note (Signed)
Addended by: Shawnie Dapper L on: 06/16/2023 09:13 AM   Modules accepted: Orders

## 2023-06-16 NOTE — Telephone Encounter (Signed)
I called patient and informed him Rx was sent into the pharmacy this am. I asked him to call the [facility], the Rx maybe has not been filled yet. Pt said he will reach out to [facility].

## 2023-06-16 NOTE — Telephone Encounter (Signed)
Lacosamide refill needed - [facility] [address]

## 2023-09-08 ENCOUNTER — Other Ambulatory Visit: Payer: Self-pay | Admitting: Family Medicine

## 2023-09-08 MED ORDER — LACOSAMIDE 150 MG PO TABS: ORAL_TABLET | ORAL | 0 refills | Status: DC

## 2023-09-08 NOTE — Addendum Note (Signed)
Addended by: Arther Abbott on: 09/08/2023 05:11 PM   Modules accepted: Orders

## 2023-09-08 NOTE — Telephone Encounter (Signed)
Called to reschedule 09/09/23 appt due to NP being out. Pt stated he is needing a refill of Lacosamide 150 MG TABS

## 2023-09-08 NOTE — Telephone Encounter (Addendum)
Dr. Marjory Lies- Amy out. You are primary MD. Pt last seen 06/05/22 and next f/u 10/15/23. Last refilled 06/18/23 #180.   Can you e-scribe until pt next appt? Thank you

## 2023-09-09 ENCOUNTER — Ambulatory Visit: Payer: Self-pay | Admitting: Family Medicine

## 2023-09-11 ENCOUNTER — Telehealth: Payer: Self-pay | Admitting: Family Medicine

## 2023-09-11 MED ORDER — LACOSAMIDE 150 MG PO TABS: ORAL_TABLET | ORAL | 0 refills | Status: DC

## 2023-09-11 NOTE — Addendum Note (Signed)
Addended by: Arther Abbott on: 09/11/2023 04:23 PM   Modules accepted: Orders

## 2023-09-11 NOTE — Addendum Note (Signed)
Addended by: Shawnie Dapper L on: 09/11/2023 04:28 PM   Modules accepted: Orders

## 2023-09-11 NOTE — Telephone Encounter (Signed)
Pt last seen 06/05/22. Has upcoming appt 10/15/23 with Amy Lomax,NP. Dr. Marjory Lies approved refill on 09/08/23 #180. Sent to [facility]. [facility]. Spoke w/ tech. Showing on back order. Cx rx. Aware we are sending to another pharmacy.   I called pt  x2 but got forwarded to VM twice. I was going to try and fit him in for sooner appt tomorrow as mychart VV with Amy.

## 2023-09-11 NOTE — Telephone Encounter (Signed)
Pt states he called the [facility] and believes the automated message he received re: his  Lacosamide 150 MG TABS was that this pharmacy is out of stock.  Pt states if that is the case he is open to this Rx being sent to [facility] [phone] please call to clarify

## 2023-09-12 ENCOUNTER — Telehealth: Payer: Self-pay

## 2023-09-12 ENCOUNTER — Telehealth (INDEPENDENT_AMBULATORY_CARE_PROVIDER_SITE_OTHER): Payer: PRIVATE HEALTH INSURANCE | Admitting: Family Medicine

## 2023-09-12 ENCOUNTER — Encounter: Payer: Self-pay | Admitting: Family Medicine

## 2023-09-12 DIAGNOSIS — R569 Unspecified convulsions: Secondary | ICD-10-CM | POA: Diagnosis not present

## 2023-09-12 MED ORDER — LACOSAMIDE 150 MG PO TABS: ORAL_TABLET | ORAL | 1 refills | Status: DC

## 2023-09-12 NOTE — Patient Instructions (Signed)
Below is our plan:  We will continue lacosamide 150mg  twice daily.   Please make sure you are consistent with timing of seizure medication. I recommend annual visit with primary care provider (PCP) for complete physical and routine blood work. I recommend daily intake of vitamin D (400-800iu) and calcium (800-1000mg ) for bone health. Discuss Dexa screening with PCP.   According to [place] law, you can not drive unless you are seizure / syncope free for at least 6 months and under physician's care.  Please maintain precautions. Do not participate in activities where a loss of awareness could harm you or someone else. No swimming alone, no tub bathing, no hot tubs, no driving, no operating motorized vehicles (cars, ATVs, motocycles, etc), lawnmowers, power tools or firearms. No standing at heights, such as rooftops, ladders or stairs. Avoid hot objects such as stoves, heaters, open fires. Wear a helmet when riding a bicycle, scooter, skateboard, etc. and avoid areas of traffic. Set your water heater to 120 degrees or less.  SUDEP is the sudden, unexpected death of someone with epilepsy, who was otherwise healthy. In SUDEP cases, no other cause of death is found when an autopsy is done. Each year, more than 1 in 1,000 people with epilepsy die from SUDEP. This is the leading cause of death in people with uncontrolled seizures. Until further answers are available, the best way to prevent SUDEP is to lower your risk by controlling seizures. Research has found that people with all types of epilepsy that experience convulsive seizures can be at risk.  Please make sure you are staying well hydrated. I recommend 50-60 ounces daily. Well balanced diet and regular exercise encouraged. Consistent sleep schedule with 6-8 hours recommended.   Please continue follow up with care team as directed.   Follow up with me in 1 year   You may receive a survey regarding today's visit. I encourage you to leave honest feed back  as I do use this information to improve patient care. Thank you for seeing me today!

## 2023-09-12 NOTE — Telephone Encounter (Signed)
Called pt to make sure he logged on to his appointment.

## 2023-09-12 NOTE — Telephone Encounter (Signed)
Pt has called stating [facility] pharmacy is more expensive than expected.  He is asking if the Lacosamide 150 MG TABS can be called into either [facility] [address] or [facility] [phone]
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
## 2023-09-12 NOTE — Telephone Encounter (Signed)
Called pt. Explained last visit 2023, needs updated visit ideally. He agreed to [employer] VV w/ Amy at 10am. Provided him his username and explained how to complete visit.

## 2023-09-15 ENCOUNTER — Telehealth: Payer: Self-pay | Admitting: Family Medicine

## 2023-09-15 NOTE — Telephone Encounter (Signed)
Called pt and let him know that NP,Amy Lomax sent him a script for his Lacosamide 150mg  on 09/12/2023 to [facility].

## 2023-09-15 NOTE — Telephone Encounter (Signed)
Patient called stating he needs a refill on Lacoasamide.

## 2023-10-09 ENCOUNTER — Telehealth: Payer: Self-pay | Admitting: Family Medicine
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
## 2023-10-09 NOTE — Telephone Encounter (Signed)
 Can cancel 3/5 appt if doing well

## 2023-10-15 ENCOUNTER — Ambulatory Visit: Payer: Self-pay | Admitting: Family Medicine

## 2024-03-10 ENCOUNTER — Other Ambulatory Visit: Payer: Self-pay | Admitting: *Deleted

## 2024-03-10 MED ORDER — LACOSAMIDE 150 MG PO TABS: ORAL_TABLET | ORAL | 1 refills | Status: DC

## 2024-03-10 NOTE — Telephone Encounter (Signed)
 Last seen on 09/12/23 Follow up scheduled on 08/16/24   Dispensed Days Supply Quantity Provider Pharmacy  LACOSAMIDE  150MG     TAB 06/18/2023 90 180 each Lomax, Amy, NP [facility]...   Rx pending to be signed

## 2024-03-12 NOTE — Telephone Encounter (Signed)
 Pt has called to f/u on this refill request for his Lacosamide 

## 2024-03-16 NOTE — Telephone Encounter (Signed)
 Noted

## 2024-03-16 NOTE — Telephone Encounter (Signed)
 Brandon Campbell, did you try all numbers he has listed? [phone] [name] (M)

## 2024-06-15 ENCOUNTER — Telehealth: Payer: Self-pay | Admitting: Family Medicine

## 2024-06-15 NOTE — Telephone Encounter (Signed)
   FYI  Pt is requesting for all refill to be sent to  [facility] [address] [place], [place] - [address] Phone: [phone]  Fax: [phone]     For future refills

## 2024-07-19 ENCOUNTER — Ambulatory Visit: Payer: Self-pay | Admitting: Family Medicine

## 2024-07-19 ENCOUNTER — Encounter: Payer: Self-pay | Admitting: Family Medicine

## 2024-07-19 VITALS — BP 148/100 | HR 71 | Temp 98.0°F | Ht 72.8 in | Wt 192.2 lb

## 2024-07-19 DIAGNOSIS — Z0000 Encounter for general adult medical examination without abnormal findings: Secondary | ICD-10-CM | POA: Diagnosis not present

## 2024-07-19 LAB — HEPATIC FUNCTION PANEL
ALT: 18 U/L (ref 0–53)
AST: 13 U/L (ref 0–37)
Albumin: 4.2 g/dL (ref 3.5–5.2)
Alkaline Phosphatase: 101 U/L (ref 39–117)
Bilirubin, Direct: 0.1 mg/dL (ref 0.0–0.3)
Total Bilirubin: 0.6 mg/dL (ref 0.2–1.2)
Total Protein: 6.6 g/dL (ref 6.0–8.3)

## 2024-07-19 LAB — CBC WITH DIFFERENTIAL/PLATELET
Basophils Absolute: 0.1 K/uL (ref 0.0–0.1)
Basophils Relative: 0.7 % (ref 0.0–3.0)
Eosinophils Absolute: 0.2 K/uL (ref 0.0–0.7)
Eosinophils Relative: 1.4 % (ref 0.0–5.0)
HCT: 40.3 % (ref 39.0–52.0)
Hemoglobin: 13.7 g/dL (ref 13.0–17.0)
Lymphocytes Relative: 19 % (ref 12.0–46.0)
Lymphs Abs: 2 K/uL (ref 0.7–4.0)
MCHC: 34 g/dL (ref 30.0–36.0)
MCV: 95.5 fl (ref 78.0–100.0)
Monocytes Absolute: 0.9 K/uL (ref 0.1–1.0)
Monocytes Relative: 8.1 % (ref 3.0–12.0)
Neutro Abs: 7.5 K/uL (ref 1.4–7.7)
Neutrophils Relative %: 70.8 % (ref 43.0–77.0)
Platelets: 353 K/uL (ref 150.0–400.0)
RBC: 4.22 Mil/uL (ref 4.22–5.81)
RDW: 13.6 % (ref 11.5–15.5)
WBC: 10.6 K/uL — ABNORMAL HIGH (ref 4.0–10.5)

## 2024-07-19 LAB — HEMOGLOBIN A1C: Hgb A1c MFr Bld: 5.2 % (ref 4.6–6.5)

## 2024-07-19 LAB — LIPID PANEL
Cholesterol: 250 mg/dL — ABNORMAL HIGH (ref 0–200)
HDL: 63.6 mg/dL (ref >39.00)
LDL Cholesterol: 177 mg/dL — ABNORMAL HIGH (ref 0–99)
NonHDL: 186.73
Total CHOL/HDL Ratio: 4
Triglycerides: 49 mg/dL (ref 0.0–149.0)
VLDL: 9.8 mg/dL (ref 0.0–40.0)

## 2024-07-19 LAB — BASIC METABOLIC PANEL WITH GFR
BUN: 22 mg/dL (ref 6–23)
CO2: 29 meq/L (ref 19–32)
Calcium: 9.6 mg/dL (ref 8.4–10.5)
Chloride: 103 meq/L (ref 96–112)
Creatinine, Ser: 1.5 mg/dL (ref 0.40–1.50)
GFR: 53.39 mL/min — ABNORMAL LOW (ref >60.00)
Glucose, Bld: 81 mg/dL (ref 70–99)
Potassium: 4.1 meq/L (ref 3.5–5.1)
Sodium: 139 meq/L (ref 135–145)

## 2024-07-19 LAB — PSA: PSA: 1.25 ng/mL (ref 0.10–4.00)

## 2024-07-19 LAB — TSH: TSH: 2.79 u[IU]/mL (ref 0.35–5.50)

## 2024-07-19 MED ORDER — LOSARTAN POTASSIUM-HCTZ 50-12.5 MG PO TABS: 1.0000 | ORAL_TABLET | Freq: Every day | ORAL | 3 refills | Status: AC

## 2024-07-19 NOTE — Progress Notes (Signed)
 Subjective:    Patient ID: Brandon Campbell, male    DOB: 07/02/1971, 53 y.o.   MRN: [id number]  HPI Here for a well exam. He has no complaints, but his wife is worried about his BP. He had been taking Losartan  HCT until he stopped this a little over a year ago. He lost some weight and his BP went down, so he stopped it. Now the past few weeks his BP at home has been in the 150's and 160's systolic and up to 110 diastolic.    Review of Systems  Constitutional: Negative.   HENT: Negative.    Eyes: Negative.   Respiratory: Negative.    Cardiovascular: Negative.   Gastrointestinal: Negative.   Genitourinary: Negative.   Musculoskeletal: Negative.   Skin: Negative.   Neurological: Negative.   Psychiatric/Behavioral: Negative.         Objective:   Physical Exam Constitutional:      General: He is not in acute distress.    Appearance: Normal appearance. He is well-developed. He is not diaphoretic.  HENT:     Head: Normocephalic and atraumatic.     Right Ear: External ear normal.     Left Ear: External ear normal.     Nose: Nose normal.     Mouth/Throat:     Pharynx: No oropharyngeal exudate.  Eyes:     General: No scleral icterus.       Right eye: No discharge.        Left eye: No discharge.     Conjunctiva/sclera: Conjunctivae normal.     Pupils: Pupils are equal, round, and reactive to light.  Neck:     Thyroid : No thyromegaly.     Vascular: No JVD.     Trachea: No tracheal deviation.  Cardiovascular:     Rate and Rhythm: Normal rate and regular rhythm.     Pulses: Normal pulses.     Heart sounds: Normal heart sounds. No murmur heard.    No friction rub. No gallop.  Pulmonary:     Effort: Pulmonary effort is normal. No respiratory distress.     Breath sounds: Normal breath sounds. No wheezing or rales.  Chest:     Chest wall: No tenderness.  Abdominal:     General: Bowel sounds are normal. There is no distension.     Palpations: Abdomen is soft. There is no  mass.     Tenderness: There is no abdominal tenderness. There is no guarding or rebound.  Genitourinary:    Penis: Normal. No tenderness.      Testes: Normal.     Prostate: Normal.     Rectum: Normal. Guaiac result negative.  Musculoskeletal:        General: No tenderness. Normal range of motion.     Cervical back: Neck supple.  Lymphadenopathy:     Cervical: No cervical adenopathy.  Skin:    General: Skin is warm and dry.     Coloration: Skin is not pale.     Findings: No erythema or rash.  Neurological:     General: No focal deficit present.     Mental Status: He is alert and oriented to person, place, and time.     Cranial Nerves: No cranial nerve deficit.     Motor: No abnormal muscle tone.     Coordination: Coordination normal.     Deep Tendon Reflexes: Reflexes are normal and symmetric. Reflexes normal.  Psychiatric:        Mood and Affect:  Mood normal.        Behavior: Behavior normal.        Thought Content: Thought content normal.        Judgment: Judgment normal.           Assessment & Plan:  Well exam. We discussed diet and exercise. Get fasting labs. His HTN is not well controlled, so he will start back on Losartan  hydrochlorothiazide 50-12.5 daily. He will report back in 3-4 weeks. We will set up a colonoscopy. Garnette Olmsted, MD
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
## 2024-08-16 ENCOUNTER — Encounter: Payer: Self-pay | Admitting: Family Medicine

## 2024-08-16 ENCOUNTER — Telehealth: Payer: PRIVATE HEALTH INSURANCE | Admitting: Family Medicine

## 2024-08-16 DIAGNOSIS — R569 Unspecified convulsions: Secondary | ICD-10-CM

## 2024-08-16 NOTE — Patient Instructions (Signed)
 Below is our plan:  We will continue lacosamide  150mg  twice daily.   Please make sure you are consistent with timing of seizure medication. I recommend annual visit with primary care provider (PCP) for complete physical and routine blood work. I recommend daily intake of vitamin D (400-800iu) and calcium  (800-1000mg ) for bone health. Discuss Dexa screening with PCP.   According to [place] law, you can not drive unless you are seizure / syncope free for at least 6 months and under physician's care.  Please maintain precautions. Do not participate in activities where a loss of awareness could harm you or someone else. No swimming alone, no tub bathing, no hot tubs, no driving, no operating motorized vehicles (cars, ATVs, motocycles, etc), lawnmowers, power tools or firearms. No standing at heights, such as rooftops, ladders or stairs. Avoid hot objects such as stoves, heaters, open fires. Wear a helmet when riding a bicycle, scooter, skateboard, etc. and avoid areas of traffic. Set your water heater to 120 degrees or less.  SUDEP is the sudden, unexpected death of someone with epilepsy, who was otherwise healthy. In SUDEP cases, no other cause of death is found when an autopsy is done. Each year, more than 1 in 1,000 people with epilepsy die from SUDEP. This is the leading cause of death in people with uncontrolled seizures. Until further answers are available, the best way to prevent SUDEP is to lower your risk by controlling seizures. Research has found that people with all types of epilepsy that experience convulsive seizures can be at risk.  Please make sure you are staying well hydrated. I recommend 50-60 ounces daily. Well balanced diet and regular exercise encouraged. Consistent sleep schedule with 6-8 hours recommended.   Please continue follow up with care team as directed.   Follow up with me in 1 year   You may receive a survey regarding today's visit. I encourage you to leave honest feed back  as I do use this information to improve patient care. Thank you for seeing me today!

## 2024-09-08 ENCOUNTER — Other Ambulatory Visit: Payer: Self-pay | Admitting: *Deleted

## 2024-09-08 MED ORDER — LACOSAMIDE 150 MG PO TABS: ORAL_TABLET | ORAL | 1 refills | Status: AC

## 2024-09-08 NOTE — Telephone Encounter (Signed)
 Dr.Camara you are work in provider, Amy is out of the office today  Last seen on 08/16/24 Follow up scheduled 09/07/25   Lacosamide  150 MG TABS 03/10/2024 -- 180 tablet 1/1 [facility] 09...   TAKE 1 TABLET BY MOUTH IN THE MORNING 1 IN THE EVENING    Rx pending to be signed

## 2024-09-16 ENCOUNTER — Telehealth: Payer: BC Managed Care – PPO | Admitting: Family Medicine

## 2025-08-23 ENCOUNTER — Telehealth: Payer: PRIVATE HEALTH INSURANCE | Admitting: Family Medicine
